# Patient Record
Sex: Male | Born: 1980 | Hispanic: No | Marital: Married | Smoking: Current every day smoker
Health system: Southern US, Community
[De-identification: ages and names within clinical notes are randomized; demographics above are authoritative.]

## PROBLEM LIST (undated history)

## (undated) DIAGNOSIS — K219 Gastro-esophageal reflux disease without esophagitis: Secondary | ICD-10-CM

---

## 2001-07-27 ENCOUNTER — Emergency Department (HOSPITAL_COMMUNITY): Admission: EM | Admit: 2001-07-27 | Discharge: 2001-07-27 | Payer: Self-pay | Admitting: Emergency Medicine

## 2001-07-27 ENCOUNTER — Encounter: Payer: Self-pay | Admitting: Emergency Medicine

## 2016-10-11 ENCOUNTER — Emergency Department (HOSPITAL_COMMUNITY)
Admission: EM | Admit: 2016-10-11 | Discharge: 2016-10-12 | Disposition: A | Discharge status: Home / Self Care | Payer: No Typology Code available for payment source | Attending: Emergency Medicine | Admitting: Emergency Medicine

## 2016-10-11 ENCOUNTER — Encounter (HOSPITAL_COMMUNITY): Payer: Self-pay | Admitting: Emergency Medicine

## 2016-10-11 ENCOUNTER — Emergency Department (HOSPITAL_COMMUNITY): Payer: No Typology Code available for payment source

## 2016-10-11 DIAGNOSIS — Y999 Unspecified external cause status: Secondary | ICD-10-CM | POA: Insufficient documentation

## 2016-10-11 DIAGNOSIS — Y92410 Unspecified street and highway as the place of occurrence of the external cause: Secondary | ICD-10-CM | POA: Insufficient documentation

## 2016-10-11 DIAGNOSIS — R51 Headache: Secondary | ICD-10-CM | POA: Insufficient documentation

## 2016-10-11 DIAGNOSIS — Y9389 Activity, other specified: Secondary | ICD-10-CM | POA: Diagnosis not present

## 2016-10-11 DIAGNOSIS — S0181XA Laceration without foreign body of other part of head, initial encounter: Secondary | ICD-10-CM | POA: Diagnosis present

## 2016-10-11 DIAGNOSIS — Z23 Encounter for immunization: Secondary | ICD-10-CM | POA: Diagnosis not present

## 2016-10-11 MED ORDER — SODIUM CHLORIDE 0.9 % IV BOLUS (SEPSIS)
1000.0000 mL | Freq: Once | INTRAVENOUS | Status: AC
  Administered 2016-10-11: 1000 mL via INTRAVENOUS

## 2016-10-11 MED ORDER — MORPHINE SULFATE (PF) 4 MG/ML IV SOLN
4.0000 mg | Freq: Once | INTRAVENOUS | Status: AC
  Administered 2016-10-11: 4 mg via INTRAVENOUS
  Filled 2016-10-11: qty 1

## 2016-10-11 MED ORDER — TETANUS-DIPHTH-ACELL PERTUSSIS 5-2.5-18.5 LF-MCG/0.5 IM SUSP
0.5000 mL | Freq: Once | INTRAMUSCULAR | Status: AC
  Administered 2016-10-11: 0.5 mL via INTRAMUSCULAR
  Filled 2016-10-11: qty 0.5

## 2016-10-11 NOTE — ED Notes (Signed)
Patient transported to CT 
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
## 2016-10-12 MED ORDER — OXYCODONE-ACETAMINOPHEN 5-325 MG PO TABS: 1.0000 | ORAL_TABLET | Freq: Four times a day (QID) | ORAL | 0 refills | Status: DC | PRN

## 2016-10-12 MED ORDER — LIDOCAINE-EPINEPHRINE (PF) 2 %-1:200000 IJ SOLN
20.0000 mL | Freq: Once | INTRAMUSCULAR | Status: AC
  Administered 2016-10-12: 20 mL via INTRADERMAL
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
## 2016-10-12 NOTE — ED Notes (Signed)
Pt stable, ambulatory, states understanding of discharge instructions 

## 2016-10-16 ENCOUNTER — Encounter (HOSPITAL_COMMUNITY): Payer: Self-pay | Admitting: Neurology

## 2016-10-16 ENCOUNTER — Emergency Department (HOSPITAL_COMMUNITY)
Admission: EM | Admit: 2016-10-16 | Discharge: 2016-10-16 | Disposition: A | Discharge status: Home / Self Care | Payer: No Typology Code available for payment source | Attending: Emergency Medicine | Admitting: Emergency Medicine

## 2016-10-16 DIAGNOSIS — F17200 Nicotine dependence, unspecified, uncomplicated: Secondary | ICD-10-CM | POA: Insufficient documentation

## 2016-10-16 DIAGNOSIS — L0390 Cellulitis, unspecified: Secondary | ICD-10-CM

## 2016-10-16 DIAGNOSIS — S01511D Laceration without foreign body of lip, subsequent encounter: Secondary | ICD-10-CM | POA: Diagnosis not present

## 2016-10-16 DIAGNOSIS — L03211 Cellulitis of face: Secondary | ICD-10-CM | POA: Diagnosis not present

## 2016-10-16 LAB — CBC WITH DIFFERENTIAL/PLATELET
BASOS ABS: 0 10*3/uL (ref 0.0–0.1)
BASOS PCT: 0 %
EOS ABS: 0.2 10*3/uL (ref 0.0–0.7)
Eosinophils Relative: 2 %
HCT: 38.6 % — ABNORMAL LOW (ref 39.0–52.0)
Hemoglobin: 13.4 g/dL (ref 13.0–17.0)
Lymphocytes Relative: 21 %
Lymphs Abs: 2.3 10*3/uL (ref 0.7–4.0)
MCH: 30.1 pg (ref 26.0–34.0)
MCHC: 34.7 g/dL (ref 30.0–36.0)
MCV: 86.7 fL (ref 78.0–100.0)
MONO ABS: 1 10*3/uL (ref 0.1–1.0)
MONOS PCT: 9 %
Neutro Abs: 7.5 10*3/uL (ref 1.7–7.7)
Neutrophils Relative %: 68 %
PLATELETS: 285 10*3/uL (ref 150–400)
RBC: 4.45 MIL/uL (ref 4.22–5.81)
RDW: 12.2 % (ref 11.5–15.5)
WBC: 11.1 10*3/uL — ABNORMAL HIGH (ref 4.0–10.5)

## 2016-10-16 LAB — BASIC METABOLIC PANEL
ANION GAP: 9 (ref 5–15)
BUN: 15 mg/dL (ref 6–20)
CALCIUM: 9.5 mg/dL (ref 8.9–10.3)
CO2: 24 mmol/L (ref 22–32)
CREATININE: 0.82 mg/dL (ref 0.61–1.24)
Chloride: 104 mmol/L (ref 101–111)
Glucose, Bld: 97 mg/dL (ref 65–99)
Potassium: 3.8 mmol/L (ref 3.5–5.1)
SODIUM: 137 mmol/L (ref 135–145)

## 2016-10-16 MED ORDER — MUPIROCIN CALCIUM 2 % EX CREA
TOPICAL_CREAM | Freq: Once | CUTANEOUS | Status: AC
  Administered 2016-10-16: 1 via TOPICAL
  Filled 2016-10-16 (×2): qty 15

## 2016-10-16 MED ORDER — CEPHALEXIN 500 MG PO CAPS: 500.0000 mg | ORAL_CAPSULE | Freq: Three times a day (TID) | ORAL | 0 refills | Status: DC

## 2016-10-16 MED ORDER — SULFAMETHOXAZOLE-TRIMETHOPRIM 800-160 MG PO TABS: 1.0000 | ORAL_TABLET | Freq: Two times a day (BID) | ORAL | 0 refills | Status: AC

## 2016-10-16 MED ORDER — CEPHALEXIN 250 MG PO CAPS
500.0000 mg | ORAL_CAPSULE | Freq: Once | ORAL | Status: AC
  Administered 2016-10-16: 500 mg via ORAL
  Filled 2016-10-16: qty 2

## 2016-10-16 MED ORDER — SULFAMETHOXAZOLE-TRIMETHOPRIM 800-160 MG PO TABS
1.0000 | ORAL_TABLET | Freq: Once | ORAL | Status: AC
  Administered 2016-10-16: 1 via ORAL
  Filled 2016-10-16: qty 1
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
## 2016-12-05 ENCOUNTER — Ambulatory Visit (HOSPITAL_COMMUNITY)
Admission: RE | Admit: 2016-12-05 | Discharge: 2016-12-05 | Disposition: A | Discharge status: Home / Self Care | Payer: No Typology Code available for payment source | Source: Ambulatory Visit | Attending: Family Medicine | Admitting: Family Medicine

## 2016-12-05 ENCOUNTER — Encounter: Payer: Self-pay | Admitting: Family Medicine

## 2016-12-05 ENCOUNTER — Ambulatory Visit: Payer: No Typology Code available for payment source | Attending: Family Medicine | Admitting: Family Medicine

## 2016-12-05 VITALS — BP 107/67 | HR 74 | Temp 97.5°F | Resp 18 | Ht 67.0 in | Wt 187.4 lb

## 2016-12-05 DIAGNOSIS — M79631 Pain in right forearm: Secondary | ICD-10-CM | POA: Insufficient documentation

## 2016-12-05 DIAGNOSIS — M25531 Pain in right wrist: Secondary | ICD-10-CM

## 2016-12-05 DIAGNOSIS — R200 Anesthesia of skin: Secondary | ICD-10-CM

## 2016-12-05 DIAGNOSIS — M25532 Pain in left wrist: Secondary | ICD-10-CM | POA: Diagnosis present

## 2016-12-05 MED ORDER — NAPROXEN 500 MG PO TABS: 500.0000 mg | ORAL_TABLET | Freq: Two times a day (BID) | ORAL | 0 refills | Status: DC
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
## 2016-12-05 NOTE — Patient Instructions (Signed)
Joint Pain Introduction Joint pain can be caused by many things. The joint can be bruised, infected, weak from aging, or sore from exercise. The pain will probably go away if you follow your doctor's instructions for home care. If your joint pain continues, more tests may be needed to help find the cause of your condition. Follow these instructions at home: Watch your condition for any changes. Follow these instructions as told to lessen the pain that you are feeling:  Take medicines only as told by your doctor.  Rest the sore joint for as long as told by your doctor. If your doctor tells you to, raise (elevate) the painful joint above the level of your heart while you are sitting or lying down.  Do not do things that cause pain or make the pain worse.  If told, put ice on the painful area:  Put ice in a plastic bag.  Place a towel between your skin and the bag.  Leave the ice on for 20 minutes, 2-3 times per day.  Wear an elastic bandage, splint, or sling as told by your doctor. Loosen the bandage or splint if your fingers or toes lose feeling (become numb) and tingle, or if they turn cold and blue.  Begin exercising or stretching the joint as told by your doctor. Ask your doctor what types of exercise are safe for you.  Keep all follow-up visits as told by your doctor. This is important. Contact a doctor if:  Your pain gets worse and medicine does not help it.  Your joint pain does not get better in 3 days.  You have more bruising or swelling.  You have a fever.  You lose 10 pounds (4.5 kg) or more without trying. Get help right away if:  You are not able to move the joint.  Your fingers or toes become numb or they turn cold and blue. This information is not intended to replace advice given to you by your health care provider. Make sure you discuss any questions you have with your health care provider. Document Released: 10/04/2009 Document Revised: 03/23/2016 Document  Reviewed: 07/28/2014  2017 Elsevier  
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
## 2016-12-08 ENCOUNTER — Telehealth: Payer: Self-pay

## 2016-12-08 NOTE — Telephone Encounter (Signed)
CMA call to inform patient about X rays  Patient did not answer But cma left a VM stating the results and if have any questions just to call back

## 2016-12-08 NOTE — Telephone Encounter (Signed)
-----   Message from Lizbeth BarkMandesia R Hairston, FNP sent at 12/08/2016  6:57 AM EST ----- -X rays showed no evidence of fracture, joint swelling, or misalignment of the bone.
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
## 2023-03-23 ENCOUNTER — Other Ambulatory Visit: Payer: Self-pay

## 2023-03-23 ENCOUNTER — Emergency Department (HOSPITAL_COMMUNITY)
Admission: EM | Admit: 2023-03-23 | Discharge: 2023-03-23 | Disposition: A | Discharge status: Home / Self Care | Payer: Self-pay | Attending: Emergency Medicine | Admitting: Emergency Medicine

## 2023-03-23 ENCOUNTER — Encounter (HOSPITAL_COMMUNITY): Payer: Self-pay

## 2023-03-23 DIAGNOSIS — L98499 Non-pressure chronic ulcer of skin of other sites with unspecified severity: Secondary | ICD-10-CM | POA: Insufficient documentation

## 2023-03-23 DIAGNOSIS — L03113 Cellulitis of right upper limb: Secondary | ICD-10-CM | POA: Insufficient documentation

## 2023-03-23 MED ORDER — CEPHALEXIN 500 MG PO CAPS: 500.0000 mg | ORAL_CAPSULE | Freq: Four times a day (QID) | ORAL | 0 refills | Status: AC

## 2023-03-23 NOTE — Discharge Instructions (Addendum)
You have been seen today for your complaint of bug bite. Your discharge medications include keflex. This is an antibiotic. You should take it as prescribed. You should take it for the entire duration of the prescription. This may cause an upset stomach. This is normal. You may take this with food. You may also eat yogurt to prevent diarrhea. Follow up with: a primary care provider in one week for reevaluation. Please seek immediate medical care if you develop any of the following symptoms: You see red streaks coming from the area. You notice the skin turns purple or black and falls off. At this time there does not appear to be the presence of an emergent medical condition, however there is always the potential for conditions to change. Please read and follow the below instructions.  Do not take your medicine if  develop an itchy rash, swelling in your mouth or lips, or difficulty breathing; call 911 and seek immediate emergency medical attention if this occurs.  You may review your lab tests and imaging results in their entirety on your MyChart account.  Please discuss all results of fully with your primary care provider and other specialist at your follow-up visit.  Note: Portions of this text may have been transcribed using voice recognition software. Every effort was made to ensure accuracy; however, inadvertent computerized transcription errors may still be present.

## 2023-03-23 NOTE — ED Triage Notes (Signed)
Pt states upon waking yesterday, he noticed a bug bite on back of right forearm. Unknown type . Redness and swelling noted.

## 2023-03-23 NOTE — ED Provider Notes (Signed)
[place] EMERGENCY DEPARTMENT AT [facility] Provider Note   CSN: [id number] Arrival date & time: 03/23/23  1246     History  Chief Complaint  Patient presents with   Insect Bite    Eugene Hudson is a 42 y.o. male.  With no significant past medical history presents ED for evaluation of right forearm swelling.  He states he believes he was bit by a bug 2 days ago.  He woke up with redness and swelling to the right forearm yesterday.  It has progressively gotten worse.  It is described as a tight sensation.  No history of trauma.  No purulent drainage.  He denies numbness, weakness or tingling in the affected extremity.  States he works outside.  He has not taken anything for symptoms prior to arrival.  He denies fevers, chills, nausea, vomiting, abdominal pain.  No history of diabetes.  HPI     Home Medications Prior to Admission medications   Medication Sig Start Date End Date Taking? Authorizing Provider  cephALEXin (KEFLEX) 500 MG capsule Take 1 capsule (500 mg total) by mouth 4 (four) times daily for 7 days. 03/23/23 03/30/23 Yes Shlok Raz, Edsel Petrin, PA-C  naproxen (NAPROSYN) 500 MG tablet Take 1 tablet (500 mg total) by mouth 2 (two) times daily with a meal. 12/05/16   Lizbeth Bark, FNP  oxyCODONE-acetaminophen (PERCOCET/ROXICET) 5-325 MG tablet Take 1 tablet by mouth every 6 (six) hours as needed for severe pain. Patient not taking: Reported on 12/05/2016 10/12/16   Tegeler, Canary Brim, MD      Allergies    Patient has no known allergies.    Review of Systems   Review of Systems  Skin:  Positive for wound.  All other systems reviewed and are negative.   Physical Exam Updated Vital Signs BP (!) 137/91 (BP Location: Left Arm)   Pulse 90   Temp 97.8 F (36.6 C) (Oral)   Resp 17   Ht 5\' 7"  (1.702 m)   Wt 83.9 kg   SpO2 99%   BMI 28.98 kg/m  Physical Exam Vitals and nursing note reviewed.  Constitutional:      General: He is not in acute  distress.    Appearance: Normal appearance. He is normal weight. He is not ill-appearing.  HENT:     Head: Normocephalic and atraumatic.  Pulmonary:     Effort: Pulmonary effort is normal. No respiratory distress.  Abdominal:     General: Abdomen is flat.  Musculoskeletal:        General: Normal range of motion.     Cervical back: Neck supple.     Comments: Reassuring 5 out of 5 bilaterally.  Capillary refill less than 2 seconds.  Sensation intact in all digits.   Skin:    General: Skin is warm and dry.     Capillary Refill: Capillary refill takes less than 2 seconds.          Comments: Small ulceration to the medial R forearm with mild surrounding erythema and swelling. No drainage, fluctuance or induration.  No streaking. Compartments are soft. No crepitus.  Neurological:     Mental Status: He is alert and oriented to person, place, and time.  Psychiatric:        Mood and Affect: Mood normal.        Behavior: Behavior normal.     ED Results / Procedures / Treatments   Labs (all labs ordered are listed, but only abnormal results are displayed) Labs  Reviewed - No data to display  EKG None  Radiology No results found.  Procedures Procedures    Medications Ordered in ED Medications - No data to display  ED Course/ Medical Decision Making/ A&P                             Medical Decision Making This patient presents to the ED for concern of right forearm pain, this involves an extensive number of treatment options, and is a complaint that carries with it a high risk of complications and morbidity.  The differential diagnosis includes cellulitis, abscess, contact dermatitis  Additional history obtained from: Nursing notes from this visit.  Afebrile, hemodynamically stable.  42 year old male presenting to ED for evaluation of possible bug bite to the right medial forearm.  Believes this occurred 2 days ago with pain beginning yesterday.  States the pain has  progressively gotten worse.  He denies symptoms of systemic infection including fevers, chills, nausea, vomiting.  He denies neurologic symptoms.  On exam there is a tiny ulcerated lesion to the medial right forearm with mild surrounding erythema.  This is likely cellulitis secondary to a bug bite. Will treat with antibiotics and encourage follow up to confirm resolution. He was given return precautions. Stable at discharge.  At this time there does not appear to be any evidence of an acute emergency medical condition and the patient appears stable for discharge with appropriate outpatient follow up. Diagnosis was discussed with patient who verbalizes understanding of care plan and is agreeable to discharge. I have discussed return precautions with patient who verbalizes understanding. Patient encouraged to follow-up with their PCP within 1 week. All questions answered.  Note: Portions of this report may have been transcribed using voice recognition software. Every effort was made to ensure accuracy; however, inadvertent computerized transcription errors may still be present.        Final Clinical Impression(s) / ED Diagnoses Final diagnoses:  Cellulitis of right upper extremity    Rx / DC Orders ED Discharge Orders          Ordered    cephALEXin (KEFLEX) 500 MG capsule  4 times daily        03/23/23 1423              Michelle Piper, Cordelia Poche 03/23/23 1423    Rolan Bucco, MD 03/24/23 1256

## 2023-03-23 NOTE — ED Notes (Signed)
Patient verbalizes understanding of discharge instructions. Opportunity for questioning and answers were provided. Pt discharged from ED. 

## 2023-03-23 NOTE — ED Notes (Signed)
Pt resting with eyes closed; respirations spontaneous, even, unlabored 

## 2024-05-09 ENCOUNTER — Other Ambulatory Visit: Payer: Self-pay

## 2024-05-09 ENCOUNTER — Encounter (HOSPITAL_BASED_OUTPATIENT_CLINIC_OR_DEPARTMENT_OTHER): Payer: Self-pay | Admitting: Emergency Medicine

## 2024-05-09 ENCOUNTER — Inpatient Hospital Stay (HOSPITAL_BASED_OUTPATIENT_CLINIC_OR_DEPARTMENT_OTHER)
Admission: EM | Admit: 2024-05-09 | Discharge: 2024-05-12 | DRG: 580 | Disposition: A | Discharge status: Home / Self Care | Payer: Self-pay | Attending: Internal Medicine | Admitting: Internal Medicine

## 2024-05-09 ENCOUNTER — Emergency Department (HOSPITAL_BASED_OUTPATIENT_CLINIC_OR_DEPARTMENT_OTHER): Payer: Self-pay | Admitting: Radiology

## 2024-05-09 DIAGNOSIS — R739 Hyperglycemia, unspecified: Secondary | ICD-10-CM | POA: Diagnosis present

## 2024-05-09 DIAGNOSIS — R030 Elevated blood-pressure reading, without diagnosis of hypertension: Secondary | ICD-10-CM | POA: Diagnosis present

## 2024-05-09 DIAGNOSIS — E66811 Obesity, class 1: Secondary | ICD-10-CM | POA: Diagnosis present

## 2024-05-09 DIAGNOSIS — Z6833 Body mass index (BMI) 33.0-33.9, adult: Secondary | ICD-10-CM

## 2024-05-09 DIAGNOSIS — E876 Hypokalemia: Secondary | ICD-10-CM | POA: Diagnosis present

## 2024-05-09 DIAGNOSIS — D72829 Elevated white blood cell count, unspecified: Secondary | ICD-10-CM | POA: Insufficient documentation

## 2024-05-09 DIAGNOSIS — E871 Hypo-osmolality and hyponatremia: Secondary | ICD-10-CM | POA: Diagnosis present

## 2024-05-09 DIAGNOSIS — Z720 Tobacco use: Secondary | ICD-10-CM | POA: Insufficient documentation

## 2024-05-09 DIAGNOSIS — W57XXXA Bitten or stung by nonvenomous insect and other nonvenomous arthropods, initial encounter: Secondary | ICD-10-CM | POA: Diagnosis present

## 2024-05-09 DIAGNOSIS — L02512 Cutaneous abscess of left hand: Principal | ICD-10-CM | POA: Diagnosis present

## 2024-05-09 DIAGNOSIS — F17200 Nicotine dependence, unspecified, uncomplicated: Secondary | ICD-10-CM | POA: Diagnosis present

## 2024-05-09 DIAGNOSIS — E860 Dehydration: Secondary | ICD-10-CM | POA: Diagnosis present

## 2024-05-09 DIAGNOSIS — L02519 Cutaneous abscess of unspecified hand: Secondary | ICD-10-CM | POA: Diagnosis present

## 2024-05-09 DIAGNOSIS — E861 Hypovolemia: Secondary | ICD-10-CM | POA: Diagnosis present

## 2024-05-09 DIAGNOSIS — L03114 Cellulitis of left upper limb: Principal | ICD-10-CM | POA: Diagnosis present

## 2024-05-09 DIAGNOSIS — T391X1A Poisoning by 4-Aminophenol derivatives, accidental (unintentional), initial encounter: Secondary | ICD-10-CM | POA: Insufficient documentation

## 2024-05-09 HISTORY — DX: Gastro-esophageal reflux disease without esophagitis: K21.9

## 2024-05-09 LAB — CBC WITH DIFFERENTIAL/PLATELET
Abs Immature Granulocytes: 0.15 K/uL — ABNORMAL HIGH (ref 0.00–0.07)
Basophils Absolute: 0.1 K/uL (ref 0.0–0.1)
Basophils Relative: 0 %
Eosinophils Absolute: 0.2 K/uL (ref 0.0–0.5)
Eosinophils Relative: 1 %
HCT: 38.9 % — ABNORMAL LOW (ref 39.0–52.0)
Hemoglobin: 13.1 g/dL (ref 13.0–17.0)
Immature Granulocytes: 1 %
Lymphocytes Relative: 10 %
Lymphs Abs: 2.2 K/uL (ref 0.7–4.0)
MCH: 29.9 pg (ref 26.0–34.0)
MCHC: 33.7 g/dL (ref 30.0–36.0)
MCV: 88.8 fL (ref 80.0–100.0)
Monocytes Absolute: 1.6 K/uL — ABNORMAL HIGH (ref 0.1–1.0)
Monocytes Relative: 7 %
Neutro Abs: 17.4 K/uL — ABNORMAL HIGH (ref 1.7–7.7)
Neutrophils Relative %: 81 %
Platelets: 310 K/uL (ref 150–400)
RBC: 4.38 MIL/uL (ref 4.22–5.81)
RDW: 12.7 % (ref 11.5–15.5)
WBC: 21.6 K/uL — ABNORMAL HIGH (ref 4.0–10.5)
nRBC: 0 % (ref 0.0–0.2)

## 2024-05-09 LAB — BASIC METABOLIC PANEL WITH GFR
Anion gap: 13 (ref 5–15)
BUN: 15 mg/dL (ref 6–20)
CO2: 21 mmol/L — ABNORMAL LOW (ref 22–32)
Calcium: 9.3 mg/dL (ref 8.9–10.3)
Chloride: 101 mmol/L (ref 98–111)
Creatinine, Ser: 0.93 mg/dL (ref 0.61–1.24)
GFR, Estimated: >60 mL/min (ref >60)
Glucose, Bld: 117 mg/dL — ABNORMAL HIGH (ref 70–99)
Potassium: 3.6 mmol/L (ref 3.5–5.1)
Sodium: 136 mmol/L (ref 135–145)

## 2024-05-09 LAB — LACTIC ACID, PLASMA: Lactic Acid, Venous: 1.8 mmol/L (ref 0.5–1.9)

## 2024-05-09 LAB — MRSA NEXT GEN BY PCR, NASAL: MRSA by PCR Next Gen: DETECTED — AB

## 2024-05-09 MED ORDER — MUPIROCIN 2 % EX OINT
1.0000 | TOPICAL_OINTMENT | Freq: Two times a day (BID) | CUTANEOUS | Status: DC
  Administered 2024-05-10 – 2024-05-12 (×6): 1 via NASAL
  Filled 2024-05-09: qty 22

## 2024-05-09 MED ORDER — CHLORHEXIDINE GLUCONATE CLOTH 2 % EX PADS
6.0000 | MEDICATED_PAD | Freq: Every day | CUTANEOUS | Status: DC
Start: 2024-05-10 — End: 2024-05-12
  Administered 2024-05-10 – 2024-05-12 (×3): 6 via TOPICAL

## 2024-05-09 MED ORDER — MORPHINE SULFATE (PF) 2 MG/ML IV SOLN
1.0000 mg | Freq: Once | INTRAVENOUS | Status: AC
  Administered 2024-05-09: 1 mg via INTRAVENOUS
  Filled 2024-05-09: qty 1

## 2024-05-09 MED ORDER — VANCOMYCIN HCL IN DEXTROSE 1-5 GM/200ML-% IV SOLN
1000.0000 mg | Freq: Once | INTRAVENOUS | Status: AC
  Administered 2024-05-09: 1000 mg via INTRAVENOUS
  Filled 2024-05-09: qty 200

## 2024-05-09 NOTE — H&P (Signed)
 History and Physical    Eugene Hudson FMW:[id number] DOB: 1980-12-18 DOA: 05/09/2024  Patient coming from: Home.  Chief Complaint: Left hand swelling.  HPI: Eugene Hudson is a 43 y.o. male with no significant past medical history presents to the ER with complaints of left hand swelling.  Patient states 3 days ago he was trying to pick his phone from the car when he thinks a spider might have bit his hand.  He experienced progressive swelling to the point that he was not able to make a fist on his hand.  ED Course: In the ER x-rays showed soft tissue swelling.  Labs showed WBC of 21.6 lactic acid of 1.8.  Dr. Romona on-call hand surgeon was consulted plan is to admit patient for possible surgery.  Started on empiric antibiotics.  Review of Systems: As per HPI, rest all negative.   History reviewed. No pertinent past medical history.  History reviewed. No pertinent surgical history.   reports that he has been smoking. He has never used smokeless tobacco. He reports current alcohol use. He reports that he does not use drugs.  No Known Allergies  History reviewed. No pertinent family history.  Prior to Admission medications   Not on File    Physical Exam: Constitutional: Moderately built and nourished. Vitals:   05/09/24 1600 05/09/24 1900 05/09/24 2042 05/09/24 2042  BP: (!) 143/93 (!) 167/94  (!) 146/86  Pulse: 91 88  86  Resp:  18  16  Temp:  98.4 F (36.9 C)  98.5 F (36.9 C)  TempSrc:      SpO2: 99% 96%  97%  Weight:   96.3 kg   Height:    5' 7 (1.702 m)   Eyes: Anicteric no pallor. ENMT: No discharge from the ears/nose or mouth. Neck: No mass felt.  No neck rigidity. Respiratory: No rhonchi or crepitations. Cardiovascular: S1-S2 heard. Abdomen: Soft nontender bowel sound present. Musculoskeletal: Left hand appears swollen with erythema. Skin: Erythema of the left hand. Neurologic: Alert awake oriented to time place and person.  Moves all  extremities. Psychiatric: Appears normal.  Normal affect.   Labs on Admission: I have personally reviewed following labs and imaging studies  CBC: Recent Labs  Lab 05/09/24 1534  WBC 21.6*  NEUTROABS 17.4*  HGB 13.1  HCT 38.9*  MCV 88.8  PLT 310   Basic Metabolic Panel: Recent Labs  Lab 05/09/24 1534  NA 136  K 3.6  CL 101  CO2 21*  GLUCOSE 117*  BUN 15  CREATININE 0.93  CALCIUM  9.3   GFR: Estimated Creatinine Clearance: 115.6 mL/min (by C-G formula based on SCr of 0.93 mg/dL). Liver Function Tests: No results for input(s): AST, ALT, ALKPHOS, BILITOT, PROT, ALBUMIN in the last 168 hours. No results for input(s): LIPASE, AMYLASE in the last 168 hours. No results for input(s): AMMONIA in the last 168 hours. Coagulation Profile: No results for input(s): INR, PROTIME in the last 168 hours. Cardiac Enzymes: No results for input(s): CKTOTAL, CKMB, CKMBINDEX, TROPONINI in the last 168 hours. BNP (last 3 results) No results for input(s): PROBNP in the last 8760 hours. HbA1C: No results for input(s): HGBA1C in the last 72 hours. CBG: No results for input(s): GLUCAP in the last 168 hours. Lipid Profile: No results for input(s): CHOL, HDL, LDLCALC, TRIG, CHOLHDL, LDLDIRECT in the last 72 hours. Thyroid Function Tests: No results for input(s): TSH, T4TOTAL, FREET4, T3FREE, THYROIDAB in the last 72 hours. Anemia Panel: No results for input(s): VITAMINB12, FOLATE, FERRITIN,  TIBC, IRON, RETICCTPCT in the last 72 hours. Urine analysis: No results found for: COLORURINE, APPEARANCEUR, LABSPEC, PHURINE, GLUCOSEU, HGBUR, BILIRUBINUR, KETONESUR, PROTEINUR, UROBILINOGEN, NITRITE, LEUKOCYTESUR Sepsis Labs: @LABRCNTIP (procalcitonin:4,lacticidven:4) ) Recent Results (from the past 240 hours)  MRSA Next Gen by PCR, Nasal     Status: Abnormal   Collection Time: 05/09/24 10:15 PM   Specimen:  Nasal Mucosa; Nasal Swab  Result Value Ref Range Status   MRSA by PCR Next Gen DETECTED (A) NOT DETECTED Final    Comment: RESULT CALLED TO, READ BACK BY AND VERIFIED WITH: C ROBERTSON RN 05/09/2024 @ 2341 BY AB (NOTE) The GeneXpert MRSA Assay (FDA approved for NASAL specimens only), is one component of a comprehensive MRSA colonization surveillance program. It is not intended to diagnose MRSA infection nor to guide or monitor treatment for MRSA infections. Test performance is not FDA approved in patients less than 7 years old. Performed at [facility], [address], [place], [place] [address]      Radiological Exams on Admission: DG Hand Complete Left Result Date: 05/09/2024 CLINICAL DATA:  MCP infection 5th digit EXAM: LEFT HAND - COMPLETE 3+ VIEW COMPARISON:  None Available. FINDINGS: No acute fracture or dislocation. There is no evidence of arthropathy or other focal bone abnormality. Soft tissue swelling about the fifth MCP joint. No radiopaque foreign body. IMPRESSION: Soft tissue swelling about the fifth joint. No radiographic findings of osteomyelitis. Electronically Signed   By: Rogelia Myers M.D.   On: 05/09/2024 17:13      Assessment/Plan Principal Problem:   Abscess of left hand    Left hand cellulitis with possible abscess for which hand surgery Dr. Romona has been consulted.  Patient will be kept n.p.o. past midnight in anticipation of procedure.  Continue empiric antibiotics. Elevated blood pressure reading follow blood pressure trends. Tobacco abuse advised about quitting.  Since patient has left hand abscess will need procedure and close monitoring and more than 2 midnight stay.   DVT prophylaxis: SCDs. Code Status: Full code. Family Communication: Discussed with patient. Disposition Plan: Medical floor. Consults called: Hydrographic surveyor. Admission status: Observation.

## 2024-05-09 NOTE — Progress Notes (Signed)
 Hospitalist Transfer Note:    Nursing staff, Please call [facility] Admits & Consults System-Wide number on Amion [phone]) as soon as patient's arrival, so appropriate admitting provider can evaluate the pt.   Transferring facility: [facility] Requesting provider: Fonda Ruby, PA (EDP at [facility]) Reason for transfer: admission for further evaluation and management of left hand abscess.     43 y.o. male with no reported significant medical history, who presented to [facility] ED complaining of  2 days of progressive left hand swelling, erythema, discomfort with range of motion, particularly involving the fifth digit of the left hand after being stung by an unclear insect on the left hand around the MCP joint of the 5th digit on Weds, 05/07/24.  Has noted proximal extension of the left hand erythema over the last day, now extending proximal to the left wrist.  He has no reported significant past medical history, including no known history of underlying diabetes.  Vital signs in the ED were notable for the following: Afebrile.  Labs were notable for CBC, which showed white blood cell count of 21,600.  Lactic acid 1.8.  Imaging notable for plain film of the left hand, which showed soft tissue swelling around the fifth MCP joint, without radiographic findings of acute osteomyelitis, nor any report of subcutaneous gas.   EDP d/w on-call hand surgeon, Dr. Romona, who requested [facility] admission to [facility] and conveyed that he will formally consult, with plan to take patient to the OR in the AM; Dr. Romona requests that the patient be kept NPO after MN.  Medications administered prior to transfer included the following: IV vancomycin .  Subsequently, I accepted this patient for transfer for observation to a MedSurg bed at [facility] for further work-up and management of the above.      Eva Pore, DO Hospitalist

## 2024-05-09 NOTE — ED Triage Notes (Signed)
 Swelling in left hand x 3 days Patient thinks it was a bite/ sting Reports spiders at his home

## 2024-05-09 NOTE — ED Provider Notes (Cosign Needed Addendum)
 [place] EMERGENCY DEPARTMENT AT [facility] Provider Note   CSN: [id number] Arrival date & time: 05/09/24  1519     Patient presents with: Insect Bite   Eugene Hudson is a 43 y.o. male.   Patient presents to the emergency department for evaluation of left hand infection.  Patient is right-hand dominant.  He states that 2 nights ago he felt as though he could have been bitten by a spider.  He noted some pain in the area of the base of the fifth digit.  He assures me that he did not punch anything or sustain any human or animal bites.  Area of swelling became worse this morning and then he had worsening pain with movement today and streaking up the arm.  No documented fevers.  No history of diabetes.       Prior to Admission medications   Medication Sig Start Date End Date Taking? Authorizing Provider  naproxen  (NAPROSYN ) 500 MG tablet Take 1 tablet (500 mg total) by mouth 2 (two) times daily with a meal. 12/05/16   Hairston, Alston SAUNDERS, FNP  oxyCODONE -acetaminophen  (PERCOCET/ROXICET) 5-325 MG tablet Take 1 tablet by mouth every 6 (six) hours as needed for severe pain. Patient not taking: Reported on 12/05/2016 10/12/16   Tegeler, Lonni PARAS, MD    Allergies: Patient has no known allergies.    Review of Systems  Updated Vital Signs BP (!) 166/97 (BP Location: Right Arm)   Pulse 94   Temp 97.9 F (36.6 C) (Oral)   Resp 20   SpO2 100%   Physical Exam Vitals and nursing note reviewed.  Constitutional:      General: He is not in acute distress.    Appearance: He is well-developed.  HENT:     Head: Normocephalic and atraumatic.  Eyes:     General:        Right eye: No discharge.        Left eye: No discharge.     Conjunctiva/sclera: Conjunctivae normal.  Cardiovascular:     Rate and Rhythm: Normal rate and regular rhythm.     Heart sounds: Normal heart sounds.  Pulmonary:     Effort: Pulmonary effort is normal.     Breath sounds: Normal breath sounds.   Abdominal:     Palpations: Abdomen is soft.     Tenderness: There is no abdominal tenderness.  Musculoskeletal:     Cervical back: Normal range of motion and neck supple.     Comments: Left hand: Patient with moderate pain with passive flexion and extension of the fifth MTP.  There is overlying erythema and swelling.  There is an ulceration centrally.  There is some spread of erythema onto the dorsum of the hand.  Patient with light lymphangitic spread noted on the volar aspect of the forearm.  Skin:    General: Skin is warm and dry.  Neurological:     Mental Status: He is alert.          (all labs ordered are listed, but only abnormal results are displayed) Labs Reviewed  CBC WITH DIFFERENTIAL/PLATELET - Abnormal; Notable for the following components:      Result Value   WBC 21.6 (*)    HCT 38.9 (*)    Neutro Abs 17.4 (*)    Monocytes Absolute 1.6 (*)    Abs Immature Granulocytes 0.15 (*)    All other components within normal limits  BASIC METABOLIC PANEL WITH GFR - Abnormal; Notable for the following components:  CO2 21 (*)    Glucose, Bld 117 (*)    All other components within normal limits  LACTIC ACID, PLASMA    EKG: None  Radiology: DG Hand Complete Left Result Date: 05/09/2024 CLINICAL DATA:  MCP infection 5th digit EXAM: LEFT HAND - COMPLETE 3+ VIEW COMPARISON:  None Available. FINDINGS: No acute fracture or dislocation. There is no evidence of arthropathy or other focal bone abnormality. Soft tissue swelling about the fifth MCP joint. No radiopaque foreign body. IMPRESSION: Soft tissue swelling about the fifth joint. No radiographic findings of osteomyelitis. Electronically Signed   By: Rogelia Myers M.D.   On: 05/09/2024 17:13     Procedures   Medications Ordered in the ED  vancomycin  (VANCOCIN ) IVPB 1000 mg/200 mL premix (has no administration in time range)   ED Course  Patient seen and examined. History obtained directly from patient. Work-up  including labs, imaging, EKG ordered in triage, if performed, were reviewed.    Labs/EKG: Independently reviewed and interpreted.  This included: CBC with white blood cell count elevated at 21.6 with predominant neutrophils; BMP glucose minimally elevated at 117, otherwise unremarkable.  Imaging: Ordered x-ray of the left hand  Medications/Fluids: Ordered: IV vancomycin   Most recent vital signs reviewed and are as follows: BP (!) 166/97 (BP Location: Right Arm)   Pulse 94   Temp 97.9 F (36.6 C) (Oral)   Resp 20   SpO2 100%   Initial impression: Left hand infection, possible abscess formation, cannot r/o septic arthritis. Given extent of infection will plan admission for IV abx and ortho consult. Will discuss with oncall hand surgery.   //  Case discussed with Dr. Romona.  Agrees with admission for IV antibiotics.  He will post patient for tomorrow morning, plan for I&D in the OR.  Will discuss with hospitalist service.  7:26 PM I discussed case with Dr. Marcene of [facility] who accepts patient.                                   Medical Decision Making Amount and/or Complexity of Data Reviewed Labs: ordered. Radiology: ordered.  Risk Prescription drug management. Decision regarding hospitalization.   Patient with left hand abscess, concern for septic arthritis.  White count is very elevated.  Plan admission to hospitalist for IV antibiotics, possible OR in the morning.     Final diagnoses:  Abscess of left hand    ED Discharge Orders     None          Desiderio Chew, PA-C 05/09/24 1922    Desiderio Chew, [place] 05/09/24 1927    Randol Simmonds, MD 05/12/24 [phone]

## 2024-05-10 ENCOUNTER — Encounter (HOSPITAL_COMMUNITY)
Admission: EM | Disposition: A | Discharge status: Home / Self Care | Payer: Self-pay | Source: Home / Self Care | Attending: Internal Medicine

## 2024-05-10 ENCOUNTER — Encounter (HOSPITAL_COMMUNITY): Payer: Self-pay | Admitting: Internal Medicine

## 2024-05-10 ENCOUNTER — Other Ambulatory Visit: Payer: Self-pay

## 2024-05-10 ENCOUNTER — Observation Stay (HOSPITAL_COMMUNITY): Payer: Self-pay | Admitting: Certified Registered"

## 2024-05-10 ENCOUNTER — Inpatient Hospital Stay (HOSPITAL_COMMUNITY): Payer: Self-pay | Admitting: Certified Registered"

## 2024-05-10 DIAGNOSIS — R030 Elevated blood-pressure reading, without diagnosis of hypertension: Secondary | ICD-10-CM | POA: Insufficient documentation

## 2024-05-10 DIAGNOSIS — E66811 Obesity, class 1: Secondary | ICD-10-CM

## 2024-05-10 DIAGNOSIS — L02512 Cutaneous abscess of left hand: Secondary | ICD-10-CM

## 2024-05-10 DIAGNOSIS — E876 Hypokalemia: Secondary | ICD-10-CM

## 2024-05-10 DIAGNOSIS — L02519 Cutaneous abscess of unspecified hand: Secondary | ICD-10-CM | POA: Diagnosis present

## 2024-05-10 DIAGNOSIS — D72829 Elevated white blood cell count, unspecified: Secondary | ICD-10-CM | POA: Insufficient documentation

## 2024-05-10 DIAGNOSIS — Z720 Tobacco use: Secondary | ICD-10-CM | POA: Insufficient documentation

## 2024-05-10 HISTORY — PX: INCISION AND DRAINAGE OF WOUND: SHX1803

## 2024-05-10 LAB — CBC WITH DIFFERENTIAL/PLATELET
Abs Immature Granulocytes: 0.17 K/uL — ABNORMAL HIGH (ref 0.00–0.07)
Basophils Absolute: 0.1 K/uL (ref 0.0–0.1)
Basophils Relative: 0 %
Eosinophils Absolute: 0.2 K/uL (ref 0.0–0.5)
Eosinophils Relative: 1 %
HCT: 39.3 % (ref 39.0–52.0)
Hemoglobin: 13.5 g/dL (ref 13.0–17.0)
Immature Granulocytes: 1 %
Lymphocytes Relative: 18 %
Lymphs Abs: 3.7 K/uL (ref 0.7–4.0)
MCH: 30.1 pg (ref 26.0–34.0)
MCHC: 34.4 g/dL (ref 30.0–36.0)
MCV: 87.5 fL (ref 80.0–100.0)
Monocytes Absolute: 1.7 K/uL — ABNORMAL HIGH (ref 0.1–1.0)
Monocytes Relative: 8 %
Neutro Abs: 15.1 K/uL — ABNORMAL HIGH (ref 1.7–7.7)
Neutrophils Relative %: 72 %
Platelets: 318 K/uL (ref 150–400)
RBC: 4.49 MIL/uL (ref 4.22–5.81)
RDW: 12.6 % (ref 11.5–15.5)
WBC: 20.9 K/uL — ABNORMAL HIGH (ref 4.0–10.5)
nRBC: 0 % (ref 0.0–0.2)

## 2024-05-10 LAB — BASIC METABOLIC PANEL WITH GFR
Anion gap: 8 (ref 5–15)
BUN: 8 mg/dL (ref 6–20)
CO2: 23 mmol/L (ref 22–32)
Calcium: 8.6 mg/dL — ABNORMAL LOW (ref 8.9–10.3)
Chloride: 102 mmol/L (ref 98–111)
Creatinine, Ser: 0.72 mg/dL (ref 0.61–1.24)
GFR, Estimated: >60 mL/min (ref >60)
Glucose, Bld: 128 mg/dL — ABNORMAL HIGH (ref 70–99)
Potassium: 3.2 mmol/L — ABNORMAL LOW (ref 3.5–5.1)
Sodium: 133 mmol/L — ABNORMAL LOW (ref 135–145)

## 2024-05-10 LAB — HIV ANTIBODY (ROUTINE TESTING W REFLEX): HIV Screen 4th Generation wRfx: NONREACTIVE

## 2024-05-10 SURGERY — IRRIGATION AND DEBRIDEMENT WOUND
Anesthesia: General

## 2024-05-10 MED ORDER — FENTANYL CITRATE (PF) 100 MCG/2ML IJ SOLN: 25.0000 ug | INTRAMUSCULAR | Status: DC | PRN

## 2024-05-10 MED ORDER — ACETAMINOPHEN 10 MG/ML IV SOLN
INTRAVENOUS | Status: AC
  Filled 2024-05-10: qty 100

## 2024-05-10 MED ORDER — LIDOCAINE 2% (20 MG/ML) 5 ML SYRINGE
INTRAMUSCULAR | Status: AC
  Filled 2024-05-10: qty 5

## 2024-05-10 MED ORDER — POTASSIUM CHLORIDE CRYS ER 20 MEQ PO TBCR
40.0000 meq | EXTENDED_RELEASE_TABLET | Freq: Once | ORAL | Status: AC
  Administered 2024-05-10: 40 meq via ORAL
  Filled 2024-05-10: qty 2

## 2024-05-10 MED ORDER — ROCURONIUM BROMIDE 10 MG/ML (PF) SYRINGE
PREFILLED_SYRINGE | INTRAVENOUS | Status: AC
  Filled 2024-05-10: qty 10

## 2024-05-10 MED ORDER — PROPOFOL 10 MG/ML IV BOLUS
INTRAVENOUS | Status: AC
  Filled 2024-05-10: qty 20

## 2024-05-10 MED ORDER — 0.9 % SODIUM CHLORIDE (POUR BTL) OPTIME
TOPICAL | Status: DC | PRN
  Administered 2024-05-10: 1000 mL

## 2024-05-10 MED ORDER — MIDAZOLAM HCL 2 MG/2ML IJ SOLN
INTRAMUSCULAR | Status: AC
  Filled 2024-05-10: qty 2

## 2024-05-10 MED ORDER — ACETAMINOPHEN 325 MG PO TABS: 650.0000 mg | ORAL_TABLET | Freq: Four times a day (QID) | ORAL | Status: DC | PRN

## 2024-05-10 MED ORDER — SODIUM CHLORIDE 0.9 % IV SOLN
2.0000 g | Freq: Three times a day (TID) | INTRAVENOUS | Status: DC
  Administered 2024-05-10 – 2024-05-12 (×7): 2 g via INTRAVENOUS
  Filled 2024-05-10 (×7): qty 12.5

## 2024-05-10 MED ORDER — OXYCODONE HCL 5 MG PO TABS: 5.0000 mg | ORAL_TABLET | Freq: Once | ORAL | Status: DC | PRN

## 2024-05-10 MED ORDER — MORPHINE SULFATE (PF) 2 MG/ML IV SOLN
1.0000 mg | INTRAVENOUS | Status: DC | PRN
  Administered 2024-05-10: 1 mg via INTRAVENOUS
  Filled 2024-05-10: qty 1

## 2024-05-10 MED ORDER — CHLORHEXIDINE GLUCONATE 0.12 % MT SOLN
15.0000 mL | Freq: Once | OROMUCOSAL | Status: AC
  Administered 2024-05-10: 15 mL via OROMUCOSAL

## 2024-05-10 MED ORDER — GLYCOPYRROLATE PF 0.2 MG/ML IJ SOSY
PREFILLED_SYRINGE | INTRAMUSCULAR | Status: AC
  Filled 2024-05-10: qty 1

## 2024-05-10 MED ORDER — SUGAMMADEX SODIUM 200 MG/2ML IV SOLN
INTRAVENOUS | Status: AC
  Filled 2024-05-10: qty 6

## 2024-05-10 MED ORDER — DEXAMETHASONE SODIUM PHOSPHATE 10 MG/ML IJ SOLN
INTRAMUSCULAR | Status: DC | PRN
  Administered 2024-05-10: 4 mg via INTRAVENOUS

## 2024-05-10 MED ORDER — VANCOMYCIN HCL IN DEXTROSE 1-5 GM/200ML-% IV SOLN
1000.0000 mg | Freq: Two times a day (BID) | INTRAVENOUS | Status: DC
  Administered 2024-05-10 – 2024-05-11 (×4): 1000 mg via INTRAVENOUS
  Filled 2024-05-10 (×4): qty 200

## 2024-05-10 MED ORDER — FENTANYL CITRATE (PF) 250 MCG/5ML IJ SOLN
INTRAMUSCULAR | Status: AC
  Filled 2024-05-10: qty 5

## 2024-05-10 MED ORDER — ONDANSETRON HCL 4 MG/2ML IJ SOLN: 4.0000 mg | Freq: Once | INTRAMUSCULAR | Status: DC | PRN

## 2024-05-10 MED ORDER — MIDAZOLAM HCL 2 MG/2ML IJ SOLN
INTRAMUSCULAR | Status: DC | PRN
  Administered 2024-05-10 (×2): 1 mg via INTRAVENOUS

## 2024-05-10 MED ORDER — ORAL CARE MOUTH RINSE: 15.0000 mL | Freq: Once | OROMUCOSAL | Status: AC

## 2024-05-10 MED ORDER — ACETAMINOPHEN 10 MG/ML IV SOLN
1000.0000 mg | Freq: Once | INTRAVENOUS | Status: DC | PRN
  Administered 2024-05-10: 1000 mg via INTRAVENOUS

## 2024-05-10 MED ORDER — ONDANSETRON HCL 4 MG/2ML IJ SOLN
INTRAMUSCULAR | Status: DC | PRN
  Administered 2024-05-10: 4 mg via INTRAVENOUS

## 2024-05-10 MED ORDER — OXYCODONE HCL 5 MG/5ML PO SOLN: 5.0000 mg | Freq: Once | ORAL | Status: DC | PRN

## 2024-05-10 MED ORDER — LACTATED RINGERS IV SOLN: INTRAVENOUS | Status: DC

## 2024-05-10 MED ORDER — CHLORHEXIDINE GLUCONATE 0.12 % MT SOLN
OROMUCOSAL | Status: AC
  Filled 2024-05-10: qty 15

## 2024-05-10 MED ORDER — LACTATED RINGERS IV SOLN: INTRAVENOUS | Status: AC

## 2024-05-10 MED ORDER — ACETAMINOPHEN 650 MG RE SUPP: 650.0000 mg | Freq: Four times a day (QID) | RECTAL | Status: DC | PRN

## 2024-05-10 MED ORDER — NICOTINE 14 MG/24HR TD PT24
14.0000 mg | MEDICATED_PATCH | Freq: Every day | TRANSDERMAL | Status: DC
  Filled 2024-05-10 (×2): qty 1

## 2024-05-10 MED ORDER — FENTANYL CITRATE (PF) 250 MCG/5ML IJ SOLN
INTRAMUSCULAR | Status: DC | PRN
  Administered 2024-05-10 (×2): 50 ug via INTRAVENOUS

## 2024-05-10 MED ORDER — ONDANSETRON HCL 4 MG/2ML IJ SOLN
INTRAMUSCULAR | Status: AC
  Filled 2024-05-10: qty 2

## 2024-05-10 MED ORDER — SUCCINYLCHOLINE CHLORIDE 200 MG/10ML IV SOSY
PREFILLED_SYRINGE | INTRAVENOUS | Status: AC
  Filled 2024-05-10: qty 10

## 2024-05-10 SURGICAL SUPPLY — 55 items
BAG COUNTER SPONGE SURGICOUNT (BAG) ×2 IMPLANT
BNDG COMPR ESMARK 4X3 LF (GAUZE/BANDAGES/DRESSINGS) IMPLANT
BNDG ELASTIC 2X5.8 VLCR STR LF (GAUZE/BANDAGES/DRESSINGS) ×2 IMPLANT
BNDG ELASTIC 3INX 5YD STR LF (GAUZE/BANDAGES/DRESSINGS) ×2 IMPLANT
BNDG ELASTIC 4INX 5YD STR LF (GAUZE/BANDAGES/DRESSINGS) IMPLANT
BNDG ELASTIC 4X5.8 VLCR STR LF (GAUZE/BANDAGES/DRESSINGS) ×2 IMPLANT
BNDG GAUZE DERMACEA FLUFF 4 (GAUZE/BANDAGES/DRESSINGS) ×4 IMPLANT
CHLORAPREP W/TINT 26 (MISCELLANEOUS) ×2 IMPLANT
CORD BIPOLAR FORCEPS 12FT (ELECTRODE) ×2 IMPLANT
COVER BACK TABLE 60X90IN (DRAPES) IMPLANT
COVER MAYO STAND STRL (DRAPES) ×2 IMPLANT
COVER SURGICAL LIGHT HANDLE (MISCELLANEOUS) ×2 IMPLANT
CUFF TOURN SGL QUICK 18X4 (TOURNIQUET CUFF) ×2 IMPLANT
CUFF TRNQT CYL 24X4X16.5-23 (TOURNIQUET CUFF) IMPLANT
DRAIN PENROSE 18X1/4 LTX STRL (DRAIN) IMPLANT
DRAPE HALF SHEET 40X57 (DRAPES) ×2 IMPLANT
DRAPE OEC MINIVIEW 54X84 (DRAPES) IMPLANT
DRAPE SURG 17X23 STRL (DRAPES) ×2 IMPLANT
DRSG ADAPTIC 3X8 NADH LF (GAUZE/BANDAGES/DRESSINGS) ×2 IMPLANT
GAUZE SPONGE 4X4 12PLY STRL (GAUZE/BANDAGES/DRESSINGS) ×2 IMPLANT
GAUZE STRETCH 2X75IN STRL (MISCELLANEOUS) IMPLANT
GAUZE XEROFORM 1X8 LF (GAUZE/BANDAGES/DRESSINGS) ×2 IMPLANT
GLOVE BIO SURGEON STRL SZ7 (GLOVE) ×2 IMPLANT
GLOVE BIOGEL PI IND STRL 7.0 (GLOVE) ×2 IMPLANT
GOWN STRL REUS W/ TWL XL LVL3 (GOWN DISPOSABLE) ×4 IMPLANT
IV CATH 18G X1.75 CATHLON (IV SOLUTION) IMPLANT
KIT BASIN OR (CUSTOM PROCEDURE TRAY) ×2 IMPLANT
KIT TURNOVER KIT B (KITS) ×2 IMPLANT
LOOP VASCLR MAXI BLUE 18IN ST (MISCELLANEOUS) IMPLANT
LOOPS VASCLR MAXI BLUE 18IN ST (MISCELLANEOUS) IMPLANT
MANIFOLD NEPTUNE II (INSTRUMENTS) IMPLANT
NDL HYPO 25GX1X1/2 BEV (NEEDLE) IMPLANT
NDL HYPO 25X1 1.5 SAFETY (NEEDLE) ×2 IMPLANT
NDL KEITH (NEEDLE) IMPLANT
NEEDLE HYPO 25GX1X1/2 BEV (NEEDLE) IMPLANT
NEEDLE HYPO 25X1 1.5 SAFETY (NEEDLE) ×1 IMPLANT
NEEDLE KEITH (NEEDLE) IMPLANT
NS IRRIG 1000ML POUR BTL (IV SOLUTION) ×2 IMPLANT
PACK ORTHO EXTREMITY (CUSTOM PROCEDURE TRAY) ×2 IMPLANT
PAD ABD 8X10 STRL (GAUZE/BANDAGES/DRESSINGS) ×2 IMPLANT
PAD ARMBOARD POSITIONER FOAM (MISCELLANEOUS) ×2 IMPLANT
PAD CAST 4YDX4 CTTN HI CHSV (CAST SUPPLIES) ×4 IMPLANT
PADDING CAST ABS COTTON 3X4 (CAST SUPPLIES) ×2 IMPLANT
SPLINT PLASTER CAST XFAST 3X15 (CAST SUPPLIES) ×2 IMPLANT
SPONGE T-LAP 4X18 ~~LOC~~+RFID (SPONGE) ×2 IMPLANT
SUTURE FIBERWR 3-0 18 TAPR NDL (SUTURE) IMPLANT
SWAB CULTURE ESWAB REG 1ML (MISCELLANEOUS) IMPLANT
SYR BULB EAR ULCER 3OZ GRN STR (SYRINGE) ×2 IMPLANT
SYR CONTROL 10ML LL (SYRINGE) IMPLANT
TOWEL GREEN STERILE (TOWEL DISPOSABLE) ×2 IMPLANT
TOWEL GREEN STERILE FF (TOWEL DISPOSABLE) ×4 IMPLANT
TUBE CONNECTING 12X1/4 (SUCTIONS) IMPLANT
UNDERPAD 30X36 HEAVY ABSORB (UNDERPADS AND DIAPERS) ×2 IMPLANT
WATER STERILE IRR 1000ML POUR (IV SOLUTION) ×2 IMPLANT
YANKAUER SUCT BULB TIP NO VENT (SUCTIONS) IMPLANT

## 2024-05-10 NOTE — Anesthesia Preprocedure Evaluation (Signed)
 Anesthesia Evaluation  Patient identified by MRN, date of birth, ID band Patient awake    Reviewed: Allergy & Precautions, NPO status , Patient's Chart, lab work & pertinent test results, reviewed documented beta blocker date and time   History of Anesthesia Complications Negative for: history of anesthetic complications  Airway Mallampati: III  TM Distance: >3 FB   Mouth opening: Limited Mouth Opening  Dental no notable dental hx.    Pulmonary neg COPD, Current Smoker and Patient abstained from smoking.   breath sounds clear to auscultation       Cardiovascular (-) angina (-) CAD, (-) Past MI and (-) Cardiac Stents  Rhythm:Regular Rate:Normal     Neuro/Psych neg Seizures    GI/Hepatic ,GERD  Medicated and Controlled,,(+) neg Cirrhosis        Endo/Other  neg diabetes    Renal/GU Renal disease     Musculoskeletal   Abdominal   Peds  Hematology   Anesthesia Other Findings   Reproductive/Obstetrics                              Anesthesia Physical Anesthesia Plan  ASA: 2  Anesthesia Plan: General   Post-op Pain Management:    Induction: Intravenous  PONV Risk Score and Plan: 2 and Ondansetron   Airway Management Planned: LMA  Additional Equipment:   Intra-op Plan:   Post-operative Plan: Extubation in OR  Informed Consent: I have reviewed the patients History and Physical, chart, labs and discussed the procedure including the risks, benefits and alternatives for the proposed anesthesia with the patient or authorized representative who has indicated his/her understanding and acceptance.     Dental advisory given  Plan Discussed with: CRNA  Anesthesia Plan Comments:          Anesthesia Quick Evaluation

## 2024-05-10 NOTE — Anesthesia Postprocedure Evaluation (Signed)
 Anesthesia Post Note  Patient: Eugene Hudson  Procedure(s) Performed: IRRIGATION AND DEBRIDEMENT WOUND     Patient location during evaluation: PACU Anesthesia Type: General Level of consciousness: awake and alert Pain management: pain level controlled Vital Signs Assessment: post-procedure vital signs reviewed and stable Respiratory status: spontaneous breathing, nonlabored ventilation, respiratory function stable and patient connected to nasal cannula oxygen Cardiovascular status: blood pressure returned to baseline and stable Postop Assessment: no apparent nausea or vomiting Anesthetic complications: no   No notable events documented.  Last Vitals:  Vitals:   05/10/24 1245 05/10/24 1801  BP: 123/76 127/74  Pulse: 79 88  Resp: 14 18  Temp: 36.9 C 36.5 C  SpO2: 96% 97%    Last Pain:  Vitals:   05/10/24 1801  TempSrc: Oral  PainSc:                  [name]

## 2024-05-10 NOTE — Op Note (Signed)
   Date of Surgery: 05/10/2024  INDICATIONS: Patient is a 43 y.o.-year-old male with a left hand infection.  Risks, benefits, and alternatives to surgery were again discussed with the patient in the preoperative area. The patient wishes to proceed with surgery.  Informed consent was signed after our discussion.   PREOPERATIVE DIAGNOSIS:  Left dorsal hand infection over small finger MCPJ  POSTOPERATIVE DIAGNOSIS: Same.  PROCEDURE:  Irrigation and drainage of dorsal hand abscess, complex Excisional debridement of skin and subcutaneous tissue   SURGEON: Carlin Galla, M.D.  ASSIST: None  ANESTHESIA:  general  IV FLUIDS AND URINE: See anesthesia.  ESTIMATED BLOOD LOSS: <5 mL.  IMPLANTS: * No implants in log *   DRAINS: Penrose x 1  COMPLICATIONS: None  DESCRIPTION OF PROCEDURE: The patient was met in the preoperative holding area where the surgical site was marked and the consent form was signed.  The patient was then taken to the operating room and transferred to the operating table.  All bony prominences were well padded.  A tourniquet was applied to the left upper arm.  General endotracheal anesthesia was induced.  The operative extremity was prepped and draped in the usual and sterile fashion.  A formal time-out was performed to confirm that this was the correct patient, surgery, side, and site.   Following formal timeout, the limb was exsanguinated by gravity and the tourniquet inflated to 250 mmHg.  I began by making a longitudinal incision directly over the swollen area in the area of the small open wound at the level of the small finger MCP joint.  There was abundant gross appearance encountered.  Aerobic and anaerobic culture swabs were obtained.  An approximately 4 cm incision was made.  The extensor apparatus was identified.  Sharp excisional debridement was performed of nonviable appearing skin and subcutaneous tissue.  The wound was thoroughly irrigated with copious  sterile saline.  The wound was closed using several 4-0 chromic sutures.  1/4 inch Penrose drain was placed in the wound.  The wound was then cleaned and dressed with Xeroform, folded Kerlix, and an Ace wrap.  The tourniquet was deflated.  All fingers were pink and well-perfused.  The patient was reversed from anesthesia and extubated uneventfully.  They were transferred from the operating table to the postoperative bed.  All counts were correct x 2 at the end of the procedure.  The patient was then taken to the PACU in stable condition.   POSTOPERATIVE PLAN: He will remain admitted to the hospital medicine service.  We will start wound care tomorrow with soaks with warm, diluted Hibiclens  solution.  Final disposition pending final antibiotic choice.   Carlin Galla, MD 12:06 PM

## 2024-05-10 NOTE — Progress Notes (Signed)
  Progress Note   Patient: Eugene Hudson FMW:[id number] DOB: [birth year] DOA: 05/09/2024     0 DOS: the patient was seen and examined on 05/10/2024   Brief hospital admission narrative: As per H&P written by Dr. Franky on 05/09/2024. Eugene Hudson is a 43 y.o. male with no significant past medical history presents to the ER with complaints of left hand swelling.  Patient states 3 days ago he was trying to pick his phone from the car when he thinks a spider might have bit his hand.  He experienced progressive swelling to the point that he was not able to make a fist on his hand.   ED Course: In the ER x-rays showed soft tissue swelling.  Labs showed WBC of 21.6 lactic acid of 1.8.  Dr. Romona on-call hand surgeon was consulted plan is to admit patient for possible surgery.  Started on empiric antibiotics.  Assessment and plan Left hand cellulitis with possible abscess - Continue current IV antibiotics - Status post I&D with washout - Continue postoperative care by orthopedic service - Follow culture results. - Continue as needed analgesics.  Tobacco abuse - Cessation counseling provided - Continue nicotine  patch  Hyperglycemia - No prior history of diabetes - Continue to follow CBG every 2 hours - Will check A1c  Elevated blood pressure without prior history of hypertension - Continue to follow vital sign - Pain most likely involved -PRN hydralazine ordered  Class I obesity -Body mass index is 33.24 kg/m.  -Low-calorie diet, portion control and increased activity discussed with patient.  Hypokalemia/hyponatremia -mild -In the setting of dehydration most likely - Continue adequate hydration and follow electrolytes trend. - Will check magnesium level.   Subjective:  No fever, no chest pain and currently afebrile 70: Complaining of left hand pain. No overnight events.  Physical Exam: Vitals:   05/10/24 1021 05/10/24 1228 05/10/24 1230 05/10/24 1245  BP: 136/87  116/76 120/78 123/76  Pulse: 83 82 95 79  Resp: 20 15 16 14   Temp: 97.9 F (36.6 C) 98.4 F (36.9 C)  98.4 F (36.9 C)  TempSrc: Oral     SpO2: 98% 92% 92% 96%  Weight:      Height:       General exam: Alert, awake, oriented x 3; in no acute distress Respiratory system: Clear to auscultation. Respiratory effort normal.  Good saturation on room air. Cardiovascular system:RRR.  Cyanosis or clubbing. Gastrointestinal system: Abdomen is obese, nondistended, soft and nontender. No organomegaly or masses felt. Normal bowel sounds heard. Central nervous system:  No focal neurological deficits. Extremities: No cyanosis or clubbing; trace edema appreciated in patient's left forearm.  Clean dressings in place after surgical intervention.  Pain with movement. Skin: No petechiae. Psychiatry: Judgement and insight appear normal. Mood & affect appropriate.    Data Reviewed: Basic metabolic panel: Sodium 133, potassium 3.2, chloride 102, bicarb 23, blood sugar 128, BUN 8, creatinine 0.71 GFR >60 CBC: WBCs 28.9, hemoglobin 13.5 and platelet count 318K   Family Communication: No family at bedside.  Disposition: Status is: Observation The patient will require care spanning > 2 midnights and should be moved to inpatient because: Continue IV antibiotics.  Dissipate discharge back home once medically stable.  Time spent: 50 minutes  Author: Eric Nunnery, MD 05/10/2024 3:36 PM  For on call review [email or web address].

## 2024-05-10 NOTE — Consult Note (Signed)
 HAND SURGERY CONSULTATION  REQUESTING PHYSICIAN: Ricky Fines, MD   Chief Complaint: Left hand infection  HPI: Eugene Hudson is a 43 y.o. male who presents with an involving the dorsal aspect of the left hand around the small finger MCP joint and dorsal hand.  This started with swelling 3 days ago.  Patient thinks he was bitten by a spider.  The swelling is progressively worsened such that he has limited active range of motion of his fingers.  The pain seems to be limited to the dorsal aspect around the small finger.  He was seen in the ER last night and subsequent admitted to the hospitalist service.  He denies any numbness or paresthesias in his hand.  He denies any pain proximal to the hand.  He denies any systemic symptoms.   Past Medical History:  Diagnosis Date   GERD (gastroesophageal reflux disease)    History reviewed. No pertinent surgical history. Social History   Socioeconomic History   Marital status: Married    Spouse name: Not on file   Number of children: Not on file   Years of education: Not on file   Highest education level: Not on file  Occupational History   Not on file  Tobacco Use   Smoking status: Every Day   Smokeless tobacco: Never  Substance and Sexual Activity   Alcohol use: Yes   Drug use: No   Sexual activity: Not on file  Other Topics Concern   Not on file  Social History Narrative   Not on file   Social Drivers of Health   Financial Resource Strain: Not on file  Food Insecurity: No Food Insecurity (05/09/2024)   Hunger Vital Sign    Worried About Running Out of Food in the Last Year: Never true    Ran Out of Food in the Last Year: Never true  Transportation Needs: No Transportation Needs (05/09/2024)   PRAPARE - Administrator, Civil Service (Medical): No    Lack of Transportation (Non-Medical): No  Physical Activity: Not on file  Stress: Not on file  Social Connections: Not on file   History reviewed. No pertinent  family history. - negative except otherwise stated in the family history section No Known Allergies Prior to Admission medications   Not on File   DG Hand Complete Left Result Date: 05/09/2024 CLINICAL DATA:  MCP infection 5th digit EXAM: LEFT HAND - COMPLETE 3+ VIEW COMPARISON:  None Available. FINDINGS: No acute fracture or dislocation. There is no evidence of arthropathy or other focal bone abnormality. Soft tissue swelling about the fifth MCP joint. No radiopaque foreign body. IMPRESSION: Soft tissue swelling about the fifth joint. No radiographic findings of osteomyelitis. Electronically Signed   By: Rogelia Myers M.D.   On: 05/09/2024 17:13   - Positive ROS: All other systems have been reviewed and were otherwise negative with the exception of those mentioned in the HPI and as above.  Physical Exam: General: No acute distress, resting comfortably Cardiovascular: BUE warm and well perfused, normal rate Respiratory: Normal WOB on RA Skin: Warm and dry Neurologic: Sensation intact distally Psychiatric: Patient is at baseline mood and affect  Left upper Extremity  Moderate swelling at the dorsal aspect of the left hand around the level of the small finger proximal phalanx and MCP joint.  There is mild dorsal swelling and very mild dorsal erythema proximal to the small finger MCP joint.  There is a very small wound at the  dorsal  aspect of the MCP joint.  He has limited extension of his finger secondary to pain but full active flexion.  Sensation is intact light touch in the median, ulnar, radial nerve distributions.  All fingers are warm and well-perfused with brisk cap refill.   Assessment: 43 year old male with left hand infection.  Plan: I recommend an OR today for irrigation and debridement of left hand.  We reviewed the nature of the surgery at length including the associated risks as well as the expected postoperative course.  Intraoperative cultures will be taken.  He will  begin wound care tomorrow with warm, diluted Hibiclens .  Thank you for the consult and the opportunity to see Mr. Eugene Hudson, M.D. [facility] 11:36 AM

## 2024-05-10 NOTE — H&P (View-Only) (Signed)
 HAND SURGERY CONSULTATION  REQUESTING PHYSICIAN: Ricky Fines, MD   Chief Complaint: Left hand infection  HPI: Eugene Hudson is a 43 y.o. male who presents with an involving the dorsal aspect of the left hand around the small finger MCP joint and dorsal hand.  This started with swelling 3 days ago.  Patient thinks he was bitten by a spider.  The swelling is progressively worsened such that he has limited active range of motion of his fingers.  The pain seems to be limited to the dorsal aspect around the small finger.  He was seen in the ER last night and subsequent admitted to the hospitalist service.  He denies any numbness or paresthesias in his hand.  He denies any pain proximal to the hand.  He denies any systemic symptoms.   Past Medical History:  Diagnosis Date   GERD (gastroesophageal reflux disease)    History reviewed. No pertinent surgical history. Social History   Socioeconomic History   Marital status: Married    Spouse name: Not on file   Number of children: Not on file   Years of education: Not on file   Highest education level: Not on file  Occupational History   Not on file  Tobacco Use   Smoking status: Every Day   Smokeless tobacco: Never  Substance and Sexual Activity   Alcohol use: Yes   Drug use: No   Sexual activity: Not on file  Other Topics Concern   Not on file  Social History Narrative   Not on file   Social Drivers of Health   Financial Resource Strain: Not on file  Food Insecurity: No Food Insecurity (05/09/2024)   Hunger Vital Sign    Worried About Running Out of Food in the Last Year: Never true    Ran Out of Food in the Last Year: Never true  Transportation Needs: No Transportation Needs (05/09/2024)   PRAPARE - Administrator, Civil Service (Medical): No    Lack of Transportation (Non-Medical): No  Physical Activity: Not on file  Stress: Not on file  Social Connections: Not on file   History reviewed. No pertinent  family history. - negative except otherwise stated in the family history section No Known Allergies Prior to Admission medications   Not on File   DG Hand Complete Left Result Date: 05/09/2024 CLINICAL DATA:  MCP infection 5th digit EXAM: LEFT HAND - COMPLETE 3+ VIEW COMPARISON:  None Available. FINDINGS: No acute fracture or dislocation. There is no evidence of arthropathy or other focal bone abnormality. Soft tissue swelling about the fifth MCP joint. No radiopaque foreign body. IMPRESSION: Soft tissue swelling about the fifth joint. No radiographic findings of osteomyelitis. Electronically Signed   By: Rogelia Myers M.D.   On: 05/09/2024 17:13   - Positive ROS: All other systems have been reviewed and were otherwise negative with the exception of those mentioned in the HPI and as above.  Physical Exam: General: No acute distress, resting comfortably Cardiovascular: BUE warm and well perfused, normal rate Respiratory: Normal WOB on RA Skin: Warm and dry Neurologic: Sensation intact distally Psychiatric: Patient is at baseline mood and affect  Left upper Extremity  Moderate swelling at the dorsal aspect of the left hand around the level of the small finger proximal phalanx and MCP joint.  There is mild dorsal swelling and very mild dorsal erythema proximal to the small finger MCP joint.  There is a very small wound at the  dorsal  aspect of the MCP joint.  He has limited extension of his finger secondary to pain but full active flexion.  Sensation is intact light touch in the median, ulnar, radial nerve distributions.  All fingers are warm and well-perfused with brisk cap refill.   Assessment: 43 year old male with left hand infection.  Plan: I recommend an OR today for irrigation and debridement of left hand.  We reviewed the nature of the surgery at length including the associated risks as well as the expected postoperative course.  Intraoperative cultures will be taken.  He will  begin wound care tomorrow with warm, diluted Hibiclens .  Thank you for the consult and the opportunity to see Mr. Eugene Hudson, M.D. [facility] 11:36 AM

## 2024-05-10 NOTE — Plan of Care (Signed)

## 2024-05-10 NOTE — Transfer of Care (Signed)
 Immediate Anesthesia Transfer of Care Note  Patient: Eugene Hudson  Procedure(s) Performed: IRRIGATION AND DEBRIDEMENT WOUND  Patient Location: PACU  Anesthesia Type:General  Level of Consciousness: awake, alert , oriented, and patient cooperative  Airway & Oxygen Therapy: Patient Spontanous Breathing  Post-op Assessment: Report given to RN and Post -op Vital signs reviewed and stable  Post vital signs: Reviewed and stable  Last Vitals:  Vitals Value Taken Time  BP 120/78 05/10/24 12:30  Temp 36.9 C 05/10/24 12:28  Pulse 82 05/10/24 12:34  Resp 16 05/10/24 12:34  SpO2 94 % 05/10/24 12:34  Vitals shown include unfiled device data.  Last Pain:  Vitals:   05/10/24 1230  TempSrc:   PainSc: Asleep         Complications: No notable events documented.

## 2024-05-10 NOTE — Anesthesia Procedure Notes (Signed)
 Procedure Name: LMA Insertion Date/Time: 05/10/2024 11:47 AM  Performed by: Bonny Avelina Caldron, CRNAPre-anesthesia Checklist: Patient identified, Emergency Drugs available, Suction available, Patient being monitored and Timeout performed Patient Re-evaluated:Patient Re-evaluated prior to induction Oxygen Delivery Method: Circle system utilized Preoxygenation: Pre-oxygenation with 100% oxygen Induction Type: IV induction LMA: LMA inserted LMA Size: 4.0 Number of attempts: 1 Dental Injury: Teeth and Oropharynx as per pre-operative assessment

## 2024-05-10 NOTE — Plan of Care (Signed)
   Problem: Education: Goal: Knowledge of General Education information will improve Description Including pain rating scale, medication(s)/side effects and non-pharmacologic comfort measures Outcome: Progressing

## 2024-05-10 NOTE — Interval H&P Note (Signed)
 History and Physical Interval Note:  05/10/2024 11:39 AM  Eugene Hudson  has presented today for surgery, with the diagnosis of Infected Left Hand.  The various methods of treatment have been discussed with the patient and family. After consideration of risks, benefits and other options for treatment, the patient has consented to  Procedure(s) with comments: IRRIGATION AND DEBRIDEMENT WOUND (N/A) - Left Hand as a surgical intervention.  The patient's history has been reviewed, patient examined, no change in status, stable for surgery.  I have reviewed the patient's chart and labs.  Questions were answered to the patient's satisfaction.     Elijan Googe

## 2024-05-10 NOTE — Progress Notes (Signed)
 Pharmacy Antibiotic Note  Eugene Hudson is a 43 y.o. male admitted on 05/09/2024 with cellulitis/abscess.  Pharmacy has been consulted for Vancomycin  dosing. WBC elevated. Renal function good.   Plan: Vancomycin  1000 mg IV q12h >>>Estimated AUC: 487 Cefepime  2g IV q8h Trend WBC, temp, renal function  F/U infectious work-up Drug levels as indicated   Height: 5' 7 (170.2 cm) Weight: 96.3 kg (212 lb 4.9 oz) IBW/kg (Calculated) : 66.1  Temp (24hrs), Avg:98.6 F (37 C), Min:97.9 F (36.6 C), Max:99.6 F (37.6 C)  Recent Labs  Lab 05/09/24 1534 05/09/24 1643  WBC 21.6*  --   CREATININE 0.93  --   LATICACIDVEN  --  1.8    Estimated Creatinine Clearance: 115.6 mL/min (by C-G formula based on SCr of 0.93 mg/dL).    No Known Allergies  Lynwood Mckusick, PharmD, BCPS Clinical Pharmacist Phone: [phone]

## 2024-05-11 ENCOUNTER — Encounter (HOSPITAL_COMMUNITY): Payer: Self-pay | Admitting: Orthopedic Surgery

## 2024-05-11 LAB — MAGNESIUM: Magnesium: 2 mg/dL (ref 1.7–2.4)

## 2024-05-11 LAB — CBC
HCT: 39 % (ref 39.0–52.0)
Hemoglobin: 13.3 g/dL (ref 13.0–17.0)
MCH: 30.2 pg (ref 26.0–34.0)
MCHC: 34.1 g/dL (ref 30.0–36.0)
MCV: 88.6 fL (ref 80.0–100.0)
Platelets: 325 K/uL (ref 150–400)
RBC: 4.4 MIL/uL (ref 4.22–5.81)
RDW: 12.5 % (ref 11.5–15.5)
WBC: 22 K/uL — ABNORMAL HIGH (ref 4.0–10.5)
nRBC: 0 % (ref 0.0–0.2)

## 2024-05-11 LAB — VANCOMYCIN, TROUGH: Vancomycin Tr: 5 ug/mL — ABNORMAL LOW (ref 15–20)

## 2024-05-11 LAB — BASIC METABOLIC PANEL WITH GFR
Anion gap: 8 (ref 5–15)
BUN: 14 mg/dL (ref 6–20)
CO2: 22 mmol/L (ref 22–32)
Calcium: 9.3 mg/dL (ref 8.9–10.3)
Chloride: 105 mmol/L (ref 98–111)
Creatinine, Ser: 0.84 mg/dL (ref 0.61–1.24)
GFR, Estimated: >60 mL/min (ref >60)
Glucose, Bld: 128 mg/dL — ABNORMAL HIGH (ref 70–99)
Potassium: 4.2 mmol/L (ref 3.5–5.1)
Sodium: 135 mmol/L (ref 135–145)

## 2024-05-11 LAB — VANCOMYCIN, PEAK: Vancomycin Pk: 15 ug/mL — ABNORMAL LOW (ref 30–40)

## 2024-05-11 MED ORDER — VANCOMYCIN HCL 1250 MG/250ML IV SOLN
1250.0000 mg | Freq: Three times a day (TID) | INTRAVENOUS | Status: DC
  Administered 2024-05-11 – 2024-05-12 (×2): 1250 mg via INTRAVENOUS
  Filled 2024-05-11 (×3): qty 250

## 2024-05-11 MED ORDER — VANCOMYCIN HCL 1250 MG/250ML IV SOLN
1250.0000 mg | Freq: Three times a day (TID) | INTRAVENOUS | Status: DC
  Filled 2024-05-11 (×2): qty 250

## 2024-05-11 NOTE — Plan of Care (Signed)

## 2024-05-11 NOTE — Progress Notes (Signed)
 PROGRESS NOTE    [name]  FMW:[id number] DOB: 1981/06/23 DOA: 05/09/2024 PCP: Patient, No Pcp Per   Brief Narrative:  Eugene Hudson is a 43 y.o. male with no significant past medical history presents to the ER with complaints of left hand swelling.  Patient states 3 days prior to admission he was bit by something, he thinks a spider, while attempting to reach under the car for his phone.  Since that time has had progressive swelling and worsening pain with worsening range of motion.  Hospitalist called for admission, hand surgery called in consult for possible procedure.  Assessment & Plan:   Principal Problem:   Abscess of left hand Active Problems:   Tobacco abuse   Leucocytosis   Hand abscess   Elevated blood pressure reading   Left hand cellulitis with possible abscess - Continue current IV antibiotics with cefepime  and vancomycin  - Status post I&D with washout 05/10/2024, tolerated well - Continue postoperative care by orthopedic service - Follow culture results. - Continue as needed analgesics.   Tobacco abuse - Cessation counseling provided - Continue nicotine  patch   Hyperglycemia - No prior history of diabetes, possibly reactive - Continue to follow glucose, hold off on insulin or hypoglycemic protocol No results found for: HGBA1C   Elevated blood pressure without prior history of hypertension - Likely elevated secondary to pain, continue to monitor -PRN hydralazine ordered   Class I obesity -Body mass index is 33.24 kg/m.  -Low-calorie diet, portion control and increased activity discussed with patient.   Hypovolemic hypokalemia/hyponatremia -Secondary to poor p.o. intake - Continue adequate hydration and follow electrolytes trend.  DVT prophylaxis: SCDs Start: 05/10/24 0200 Code Status:   Code Status: Full Code Family Communication: None present  Status is: Inpatient  Dispo: The patient is from: Home              Anticipated d/c is to:  Home              Anticipated d/c date is: 24 to 48 hours pending clinical course              Patient currently not medically stable for discharge  Consultants:  Orthopedics/hand surgery  Procedures:  I&D washout of left hand 05/10/2024  Antimicrobials:  Cefepime , vancomycin   Subjective: No acute issues or events overnight, pain currently well-controlled denies nausea vomiting diarrhea constipation headache fevers chills or chest pain  Objective: Vitals:   05/10/24 1801 05/10/24 1917 05/11/24 0425 05/11/24 0746  BP: 127/74 136/76 123/69 135/77  Pulse: 88 95 76 79  Resp: 18 16 16 18   Temp: 97.7 F (36.5 C) 98.2 F (36.8 C) 97.8 F (36.6 C) 97.8 F (36.6 C)  TempSrc: Oral Oral Oral Oral  SpO2: 97% 100% 100% 100%  Weight:      Height:        Intake/Output Summary (Last 24 hours) at 05/11/2024 0827 Last data filed at 05/10/2024 1526 Gross per 24 hour  Intake 614.7 ml  Output 5 ml  Net 609.7 ml   Filed Weights   05/09/24 2042 05/10/24 1015  Weight: 96.3 kg 96.3 kg    Examination:  General:  Pleasantly resting in bed, No acute distress. HEENT:  Normocephalic atraumatic.  Sclerae nonicteric, noninjected.  Extraocular movements intact bilaterally. Neck:  Without mass or deformity.  Trachea is midline. Lungs:  Clear to auscultate bilaterally without rhonchi, wheeze, or rales. Heart:  Regular rate and rhythm.  Without murmurs, rubs, or gallops. Abdomen:  Soft, nontender, nondistended.  Without guarding or rebound. Extremities: Left hand bandage clean dry intact. Skin:  Warm and dry, no erythema.  Data Reviewed: I have personally reviewed following labs and imaging studies  CBC: Recent Labs  Lab 05/09/24 1534 05/10/24 0459 05/11/24 0551  WBC 21.6* 20.9* 22.0*  NEUTROABS 17.4* 15.1*  --   HGB 13.1 13.5 13.3  HCT 38.9* 39.3 39.0  MCV 88.8 87.5 88.6  PLT 310 318 325   Basic Metabolic Panel: Recent Labs  Lab 05/09/24 1534 05/10/24 0459 05/11/24 0551  NA 136  133* 135  K 3.6 3.2* 4.2  CL 101 102 105  CO2 21* 23 22  GLUCOSE 117* 128* 128*  BUN 15 8 14   CREATININE 0.93 0.72 0.84  CALCIUM  9.3 8.6* 9.3  MG  --   --  2.0   GFR: Estimated Creatinine Clearance: 128 mL/min (by C-G formula based on SCr of 0.84 mg/dL).  Sepsis Labs: Recent Labs  Lab 05/09/24 1643  LATICACIDVEN 1.8    Recent Results (from the past 240 hours)  MRSA Next Gen by PCR, Nasal     Status: Abnormal   Collection Time: 05/09/24 10:15 PM   Specimen: Nasal Mucosa; Nasal Swab  Result Value Ref Range Status   MRSA by PCR Next Gen DETECTED (A) NOT DETECTED Final    Comment: RESULT CALLED TO, READ BACK BY AND VERIFIED WITH: C ROBERTSON RN 05/09/2024 @ 2341 BY AB (NOTE) The GeneXpert MRSA Assay (FDA approved for NASAL specimens only), is one component of a comprehensive MRSA colonization surveillance program. It is not intended to diagnose MRSA infection nor to guide or monitor treatment for MRSA infections. Test performance is not FDA approved in patients less than 38 years old. Performed at [facility], [address] [place], [place] [address]   Aerobic/Anaerobic Culture w Gram Stain (surgical/deep wound)     Status: None (Preliminary result)   Collection Time: 05/10/24 12:00 PM   Specimen: Path fluid; Body Fluid  Result Value Ref Range Status   Specimen Description FLUID  Final   Special Requests LEFT DORSAL HAND  Final   Gram Stain   Final    RARE WBC PRESENT, PREDOMINANTLY PMN RARE GRAM POSITIVE COCCI Performed at [facility], [address], [place], [place] [address]    Culture PENDING  Incomplete   Report Status PENDING  Incomplete         Radiology Studies: DG Hand Complete Left Result Date: 05/09/2024 CLINICAL DATA:  MCP infection 5th digit EXAM: LEFT HAND - COMPLETE 3+ VIEW COMPARISON:  None Available. FINDINGS: No acute fracture or dislocation. There is no evidence of arthropathy or other focal bone abnormality. Soft tissue  swelling about the fifth MCP joint. No radiopaque foreign body. IMPRESSION: Soft tissue swelling about the fifth joint. No radiographic findings of osteomyelitis. Electronically Signed   By: Rogelia Myers M.D.   On: 05/09/2024 17:13        Scheduled Meds:  Chlorhexidine  Gluconate Cloth  6 each Topical Daily   mupirocin  ointment  1 Application Nasal BID   nicotine   14 mg Transdermal Daily   Continuous Infusions:  ceFEPime  (MAXIPIME ) IV 2 g (05/11/24 0553)   vancomycin  1,000 mg (05/10/24 2116)     LOS: 1 day   Time spent:  Elsie JAYSON Montclair, DO [facility]  If 7PM-7AM, please contact night-coverage [email or web address]  05/11/2024, 8:27 AM

## 2024-05-11 NOTE — Progress Notes (Signed)
 Patient resting comfortably.  Pain well controlled.  Wound over dorsum of small finger is clean.  Moderate swelling.  No drainage.  Wound largely left open to drain.  Limited AROM of RF and SF secondary to swelling.  SILT m/u/r distributions.  Patient is POD 1 s/p I&D of left hand abscess  - Will start wound care today with warm, diluted Hibiclens  solution; order placed - Patient may need wound care supplies at discharge - Introp cultures growing GPCs - No additional surgical plans - Dispo pending final abx choice; I can see him in the office in 7-10 days or so  Bebe Galla, M.D. [facility]

## 2024-05-11 NOTE — Progress Notes (Addendum)
 Pharmacy Antibiotic Note  Eugene Hudson is a 43 y.o. male admitted on 05/09/2024 with cellulitis/abscess.  Pharmacy has been consulted for Vancomycin  dosing. WBC elevated. Renal function is stable.  7/13 PM update Vancomycin  Peak: 15 mcg/dL Vancomycin  Trough: 5 mcg/dL Per AUC dosing calculator, this is subtherapeutic with an AUC of 254.7, will need to increase dose and frequency to Vancomycin  1250 mg q8  Plan: Increase to Vancomycin  1250 mg IV q8 >>>Estimated AUC: 475.9 Vancomycin  peak and trough to be collected as deemed necessary Cefepime  2g IV q8h Trend WBC, temp, renal function  F/U infectious work-up Drug levels as indicated   Height: 5' 7.01 (170.2 cm) Weight: 96.3 kg (212 lb 4.9 oz) IBW/kg (Calculated) : 66.12  Temp (24hrs), Avg:97.8 F (36.6 C), Min:97.7 F (36.5 C), Max:97.8 F (36.6 C)  Recent Labs  Lab 05/09/24 1534 05/09/24 1643 05/10/24 0459 05/11/24 0551 05/11/24 1325 05/11/24 2046  WBC 21.6*  --  20.9* 22.0*  --   --   CREATININE 0.93  --  0.72 0.84  --   --   LATICACIDVEN  --  1.8  --   --   --   --   VANCOTROUGH  --   --   --   --   --  5*  VANCOPEAK  --   --   --   --  15*  --     Estimated Creatinine Clearance: 128 mL/min (by C-G formula based on SCr of 0.84 mg/dL).    No Known Allergies  R. Samual Satterfield, PharmD PGY-1 Acute Care Pharmacy Resident [facility] 05/11/2024 9:59 PM

## 2024-05-12 ENCOUNTER — Other Ambulatory Visit (HOSPITAL_COMMUNITY): Payer: Self-pay

## 2024-05-12 LAB — HEMOGLOBIN A1C
Hgb A1c MFr Bld: 5.7 % — ABNORMAL HIGH (ref 4.8–5.6)
Mean Plasma Glucose: 117 mg/dL

## 2024-05-12 LAB — BASIC METABOLIC PANEL WITH GFR
Anion gap: 8 (ref 5–15)
BUN: 13 mg/dL (ref 6–20)
CO2: 23 mmol/L (ref 22–32)
Calcium: 8.8 mg/dL — ABNORMAL LOW (ref 8.9–10.3)
Chloride: 103 mmol/L (ref 98–111)
Creatinine, Ser: 0.77 mg/dL (ref 0.61–1.24)
GFR, Estimated: >60 mL/min (ref >60)
Glucose, Bld: 100 mg/dL — ABNORMAL HIGH (ref 70–99)
Potassium: 4.3 mmol/L (ref 3.5–5.1)
Sodium: 134 mmol/L — ABNORMAL LOW (ref 135–145)

## 2024-05-12 LAB — CBC
HCT: 41.1 % (ref 39.0–52.0)
Hemoglobin: 13.9 g/dL (ref 13.0–17.0)
MCH: 30 pg (ref 26.0–34.0)
MCHC: 33.8 g/dL (ref 30.0–36.0)
MCV: 88.6 fL (ref 80.0–100.0)
Platelets: 351 K/uL (ref 150–400)
RBC: 4.64 MIL/uL (ref 4.22–5.81)
RDW: 12.7 % (ref 11.5–15.5)
WBC: 12 K/uL — ABNORMAL HIGH (ref 4.0–10.5)
nRBC: 0 % (ref 0.0–0.2)

## 2024-05-12 MED ORDER — NICOTINE 14 MG/24HR TD PT24: 14.0000 mg | MEDICATED_PATCH | Freq: Every day | TRANSDERMAL | 0 refills | Status: AC

## 2024-05-12 MED ORDER — ACETAMINOPHEN 325 MG PO TABS: 650.0000 mg | ORAL_TABLET | Freq: Four times a day (QID) | ORAL | 0 refills | Status: AC | PRN

## 2024-05-12 MED ORDER — DOXYCYCLINE HYCLATE 100 MG PO TABS: 100.0000 mg | ORAL_TABLET | Freq: Two times a day (BID) | ORAL | 0 refills | Status: AC

## 2024-05-12 MED ORDER — DOXYCYCLINE HYCLATE 100 MG PO TABS: 100.0000 mg | ORAL_TABLET | Freq: Two times a day (BID) | ORAL | Status: DC

## 2024-05-12 NOTE — TOC Transition Note (Signed)
 Transition of Care [facility]) - Discharge Note   Patient Details  Name: Eugene Hudson MRN: [id number] Date of Birth: May 10, 1981  Transition [facility] Care [facility] CM/SW Contact:  Roxie KANDICE Stain, RN Phone Number: 05/12/2024, 1:40 PM   Clinical Narrative:    Patient stable for discharge.  Patient declines PCP apt.  MATCH letter sent to [facility] pharmacy.     Final next level of care: Home/Self Care Barriers to Discharge: Barriers Resolved   Patient Goals and CMS Choice Patient states their goals for this hospitalization and ongoing recovery are:: return home          Discharge Placement             home          Discharge Plan and Services Additional resources added to the After Visit Summary for     Discharge Planning Services: MATCH Program                                 Social Drivers of Health (SDOH) Interventions SDOH Screenings   Food Insecurity: No Food Insecurity (05/09/2024)  Housing: Low Risk  (05/09/2024)  Transportation Needs: No Transportation Needs (05/09/2024)  Utilities: Not At Risk (05/09/2024)  Tobacco Use: High Risk (05/10/2024)     Readmission Risk Interventions     No data to display

## 2024-05-12 NOTE — Discharge Summary (Signed)
 Physician Discharge Summary  Eugene Hudson FMW:[id number] DOB: 1980/11/04 DOA: 05/09/2024  PCP: Patient, No Pcp Per  Admit date: 05/09/2024 Discharge date: 05/12/2024  Admitted From: Home Disposition: Home  Recommendations for Outpatient Follow-up:  Follow up with PCP in 1-2 weeks Follow-up with surgery in 1 week as discussed  Home Health: None Equipment/Devices: None  Discharge Condition: Stable CODE STATUS: Full Diet recommendation: Low-salt low-fat low-carb diet  Brief/Interim Summary: Eugene Hudson is a 43 y.o. male with no significant past medical history presents to the ER with complaints of left hand swelling.  Patient states 3 days prior to admission he was bit by something, he thinks a spider, while attempting to reach under the car for his phone.  Since that time has had progressive swelling and worsening pain with worsening range of motion.  Hospitalist called for admission, hand surgery called in consult for possible procedure.  Patient tolerated I&D/washout on 05/10/2024.  Wound continues to improve, discussed with orthopedic/hand surgery who recommend transition to p.o. antibiotics with discharge and close outpatient follow-up.  Wound care as below.  Will transition to doxycycline  given cultures for broad-spectrum and pseudomonal coverage.  Patient otherwise stable and agreeable for discharge, follow-up with PCP as scheduled, follow-up with surgery in 1 week.  Discharge Diagnoses:  Principal Problem:   Abscess of left hand Active Problems:   Tobacco abuse   Leucocytosis   Hand abscess   Elevated blood pressure reading  Left hand cellulitis with possible abscess - De-escalate to doxycycline  -continue for 10 days - Status post I&D with washout 05/10/2024, tolerated well - Pain well-controlled, defer to orthopedic services - Outpatient follow-up with Ortho as discussed   Tobacco abuse - Cessation counseling provided - Continue nicotine  patch provided    Hyperglycemia, likely reactive - A1c 5.7  - no prior history of diabetes, possibly reactive   Elevated blood pressure without prior history of hypertension - Likely elevated secondary to pain, continue to monitor - Well-controlled now with appropriate pain control   Class I obesity -Body mass index is 33.24 kg/m.  -Low-calorie diet, portion control and increased activity discussed with patient.   Hypovolemic hypokalemia/hyponatremia -Secondary to poor p.o. intake -resolved with IV and p.o. intake   Discharge Instructions  Discharge Instructions     Call MD for:  difficulty breathing, headache or visual disturbances   Complete by: As directed    Call MD for:  extreme fatigue   Complete by: As directed    Call MD for:  hives   Complete by: As directed    Call MD for:  persistant dizziness or light-headedness   Complete by: As directed    Call MD for:  persistant nausea and vomiting   Complete by: As directed    Call MD for:  severe uncontrolled pain   Complete by: As directed    Call MD for:  temperature >100.4   Complete by: As directed    Diet - low sodium heart healthy   Complete by: As directed    Discharge wound care:   Complete by: As directed    Soak operative hand in warm, diluted Hibiclens  solution for 10 minutes.  Fill basin with warm, bath temperature water.  Add two 15 mL packet to water or three heavy squirts from bottle (~ 2 tablespoons).  Soak hand for 10 minutes and encourage patient to move fingers around in water.  Pat dry and apply a clean, dry dressing.   Increase activity slowly   Complete by: As directed  Allergies as of 05/12/2024   No Known Allergies      Medication List     TAKE these medications    acetaminophen  325 MG tablet Commonly known as: TYLENOL  Take 2 tablets (650 mg total) by mouth every 6 (six) hours as needed for up to 20 days for mild pain (pain score 1-3) or fever (or Fever >/= 101).   doxycycline  100 MG  tablet Commonly known as: VIBRA -TABS Take 1 tablet (100 mg total) by mouth 2 (two) times daily.   nicotine  14 mg/24hr patch Commonly known as: NICODERM CQ  - dosed in mg/24 hours Place 1 patch (14 mg total) onto the skin daily. Start taking on: May 13, 2024               Discharge Care Instructions  (From admission, onward)           Start     Ordered   05/12/24 0000  Discharge wound care:       Comments: Soak operative hand in warm, diluted Hibiclens  solution for 10 minutes.  Fill basin with warm, bath temperature water.  Add two 15 mL packet to water or three heavy squirts from bottle (~ 2 tablespoons).  Soak hand for 10 minutes and encourage patient to move fingers around in water.  Pat dry and apply a clean, dry dressing.   05/12/24 1321            No Known Allergies  Consultations: Orthopedic/hand  Procedures/Studies: DG Hand Complete Left Result Date: 05/09/2024 CLINICAL DATA:  MCP infection 5th digit EXAM: LEFT HAND - COMPLETE 3+ VIEW COMPARISON:  None Available. FINDINGS: No acute fracture or dislocation. There is no evidence of arthropathy or other focal bone abnormality. Soft tissue swelling about the fifth MCP joint. No radiopaque foreign body. IMPRESSION: Soft tissue swelling about the fifth joint. No radiographic findings of osteomyelitis. Electronically Signed   By: Rogelia Myers M.D.   On: 05/09/2024 17:13     Subjective: No acute issues or events overnight, pain well-controlled denies nausea vomiting diarrhea constipation fever chills or chest pain   Discharge Exam: Vitals:   05/12/24 0402 05/12/24 0808  BP: (!) 138/96 (!) 129/99  Pulse: 75 75  Resp: 16 18  Temp: 98.1 F (36.7 C) 97.8 F (36.6 C)  SpO2: 98% 98%   Vitals:   05/11/24 1640 05/11/24 1934 05/12/24 0402 05/12/24 0808  BP: 123/80 135/80 (!) 138/96 (!) 129/99  Pulse: 79 78 75 75  Resp:  16 16 18   Temp: 97.7 F (36.5 C) 97.8 F (36.6 C) 98.1 F (36.7 C) 97.8 F (36.6 C)   TempSrc: Oral Oral Oral Oral  SpO2: 100% 97% 98% 98%  Weight:      Height:        General: Pt is alert, awake, not in acute distress Cardiovascular: RRR, S1/S2 +, no rubs, no gallops Respiratory: CTA bilaterally, no wheezing, no rhonchi Abdominal: Soft, NT, ND, bowel sounds + Extremities: Left hand bandage clean dry intact without overt erythema, purulence or swelling    The results of significant diagnostics from this hospitalization (including imaging, microbiology, ancillary and laboratory) are listed below for reference.     Microbiology: Recent Results (from the past 240 hours)  MRSA Next Gen by PCR, Nasal     Status: Abnormal   Collection Time: 05/09/24 10:15 PM   Specimen: Nasal Mucosa; Nasal Swab  Result Value Ref Range Status   MRSA by PCR Next Gen DETECTED (A) NOT DETECTED Final  Comment: RESULT CALLED TO, READ BACK BY AND VERIFIED WITH: C ROBERTSON RN 05/09/2024 @ 2341 BY AB (NOTE) The GeneXpert MRSA Assay (FDA approved for NASAL specimens only), is one component of a comprehensive MRSA colonization surveillance program. It is not intended to diagnose MRSA infection nor to guide or monitor treatment for MRSA infections. Test performance is not FDA approved in patients less than 31 years old. Performed at [facility], [address] [place], [place] [address]   Aerobic/Anaerobic Culture w Gram Stain (surgical/deep wound)     Status: None (Preliminary result)   Collection Time: 05/10/24 12:00 PM   Specimen: Path fluid; Body Fluid  Result Value Ref Range Status   Specimen Description FLUID  Final   Special Requests LEFT DORSAL HAND  Final   Gram Stain   Final    RARE WBC PRESENT, PREDOMINANTLY PMN RARE GRAM POSITIVE COCCI Performed at [facility], [address] [place], [place] [address]    Culture FEW METHICILLIN RESISTANT STAPHYLOCOCCUS AUREUS  Final   Report Status PENDING  Incomplete   Organism ID, Bacteria METHICILLIN RESISTANT  STAPHYLOCOCCUS AUREUS  Final      Susceptibility   Methicillin resistant staphylococcus aureus - MIC*    CIPROFLOXACIN >=8 RESISTANT Resistant     ERYTHROMYCIN >=8 RESISTANT Resistant     GENTAMICIN <=0.5 SENSITIVE Sensitive     OXACILLIN >=4 RESISTANT Resistant     TETRACYCLINE <=1 SENSITIVE Sensitive     VANCOMYCIN  1 SENSITIVE Sensitive     TRIMETH /SULFA  >=320 RESISTANT Resistant     CLINDAMYCIN <=0.25 SENSITIVE Sensitive     RIFAMPIN <=0.5 SENSITIVE Sensitive     Inducible Clindamycin NEGATIVE Sensitive     LINEZOLID 2 SENSITIVE Sensitive     * FEW METHICILLIN RESISTANT STAPHYLOCOCCUS AUREUS     Labs:  Basic Metabolic Panel: Recent Labs  Lab 05/09/24 1534 05/10/24 0459 05/11/24 0551 05/12/24 0606  NA 136 133* 135 134*  K 3.6 3.2* 4.2 4.3  CL 101 102 105 103  CO2 21* 23 22 23   GLUCOSE 117* 128* 128* 100*  BUN 15 8 14 13   CREATININE 0.93 0.72 0.84 0.77  CALCIUM  9.3 8.6* 9.3 8.8*  MG  --   --  2.0  --    CBC: Recent Labs  Lab 05/09/24 1534 05/10/24 0459 05/11/24 0551 05/12/24 0606  WBC 21.6* 20.9* 22.0* 12.0*  NEUTROABS 17.4* 15.1*  --   --   HGB 13.1 13.5 13.3 13.9  HCT 38.9* 39.3 39.0 41.1  MCV 88.8 87.5 88.6 88.6  PLT 310 318 325 351   Hgb A1c Recent Labs    05/10/24 0913  HGBA1C 5.7*   Sepsis Labs Recent Labs  Lab 05/09/24 1534 05/10/24 0459 05/11/24 0551 05/12/24 0606  WBC 21.6* 20.9* 22.0* 12.0*   Microbiology Recent Results (from the past 240 hours)  MRSA Next Gen by PCR, Nasal     Status: Abnormal   Collection Time: 05/09/24 10:15 PM   Specimen: Nasal Mucosa; Nasal Swab  Result Value Ref Range Status   MRSA by PCR Next Gen DETECTED (A) NOT DETECTED Final    Comment: RESULT CALLED TO, READ BACK BY AND VERIFIED WITH: C ROBERTSON RN 05/09/2024 @ 2341 BY AB (NOTE) The GeneXpert MRSA Assay (FDA approved for NASAL specimens only), is one component of a comprehensive MRSA colonization surveillance program. It is not intended to diagnose  MRSA infection nor to guide or monitor treatment for MRSA infections. Test performance is not FDA  approved in patients less than 34 years old. Performed at [facility], [address] [place], [place] [address]   Aerobic/Anaerobic Culture w Gram Stain (surgical/deep wound)     Status: None (Preliminary result)   Collection Time: 05/10/24 12:00 PM   Specimen: Path fluid; Body Fluid  Result Value Ref Range Status   Specimen Description FLUID  Final   Special Requests LEFT DORSAL HAND  Final   Gram Stain   Final    RARE WBC PRESENT, PREDOMINANTLY PMN RARE GRAM POSITIVE COCCI Performed at [facility], [address], [place], [place] [address]    Culture FEW METHICILLIN RESISTANT STAPHYLOCOCCUS AUREUS  Final   Report Status PENDING  Incomplete   Organism ID, Bacteria METHICILLIN RESISTANT STAPHYLOCOCCUS AUREUS  Final      Susceptibility   Methicillin resistant staphylococcus aureus - MIC*    CIPROFLOXACIN >=8 RESISTANT Resistant     ERYTHROMYCIN >=8 RESISTANT Resistant     GENTAMICIN <=0.5 SENSITIVE Sensitive     OXACILLIN >=4 RESISTANT Resistant     TETRACYCLINE <=1 SENSITIVE Sensitive     VANCOMYCIN  1 SENSITIVE Sensitive     TRIMETH /SULFA  >=320 RESISTANT Resistant     CLINDAMYCIN <=0.25 SENSITIVE Sensitive     RIFAMPIN <=0.5 SENSITIVE Sensitive     Inducible Clindamycin NEGATIVE Sensitive     LINEZOLID 2 SENSITIVE Sensitive     * FEW METHICILLIN RESISTANT STAPHYLOCOCCUS AUREUS     Time coordinating discharge: Over 30 minutes  SIGNED:   Elsie JAYSON Montclair, DO [facility] 05/12/2024, 1:23 PM Pager   If 7PM-7AM, please contact night-coverage [email or web address]

## 2024-05-12 NOTE — TOC CM/SW Note (Signed)
 Transition of Care [facility]) - Inpatient Brief Assessment   Patient Details  Name: Eugene Hudson MRN: [id number] Date of Birth: 08-07-1981  Transition of Care [facility]) CM/SW Contact:    Lauraine FORBES Saa, LCSW Phone Number: 05/12/2024, 9:07 AM   Clinical Narrative:  9:07 AM Per chart review, patient resides at home with spouse. Patient does not have a PCP or insurance. Financial counseling consulted for further assistance regarding insurance. Patient does not have SNF/HH/DME history. Patient's preferred pharmacy's are [facility] [address] [facility] and [facility] [address] [place] ([place]). TOC will continue to follow and be available to assist.  Transition of Care Asessment: Insurance and Status: Selfpay Patient has primary care physician: No Home environment has been reviewed: Private Residence Prior level of function:: N/A Prior/Current Home Services: No current home services Social Drivers of Health Review: SDOH reviewed no interventions necessary Readmission risk has been reviewed: Yes Transition of care needs: transition of care needs identified, TOC will continue to follow

## 2024-05-12 NOTE — Progress Notes (Signed)
  [facility] Pharmacies please call [phone] for claim processing assistance.  Rx BIN: [id number] Rx Group: [id number] Rx PCN: [id number] Relationship Code: 1 Person Code: [id number]  Patient ID (MRN): [id number]    Patient Name: Eugene Hudson   Patient DOB 1981/06/21   Discharge Date:05/12/2024  Expiration Date:05/19/2024 (must be filled within 7 days of discharge)

## 2024-05-12 NOTE — Plan of Care (Signed)

## 2024-05-12 NOTE — Progress Notes (Signed)
 S/p for I&D for hand infection. Culture grew out MRSA that is sens to doxy. Ok to optimize to doxy x10 post op for discharge per Dr. Lue.  Sergio Batch, PharmD, BCIDP, AAHIVP, CPP Infectious Disease Pharmacist 05/12/2024 12:06 PM

## 2024-05-15 LAB — AEROBIC/ANAEROBIC CULTURE W GRAM STAIN (SURGICAL/DEEP WOUND)
